# Patient Record
Sex: Female | Born: 1940 | Race: White | Hispanic: No | Marital: Married | Smoking: Former smoker
Health system: Southern US, Community
[De-identification: ages and names within clinical notes are randomized; demographics above are authoritative.]

## PROBLEM LIST (undated history)

## (undated) DIAGNOSIS — R1310 Dysphagia, unspecified: Secondary | ICD-10-CM

## (undated) DIAGNOSIS — K219 Gastro-esophageal reflux disease without esophagitis: Secondary | ICD-10-CM

## (undated) DIAGNOSIS — IMO0001 Reserved for inherently not codable concepts without codable children: Secondary | ICD-10-CM

## (undated) DIAGNOSIS — M109 Gout, unspecified: Secondary | ICD-10-CM

## (undated) DIAGNOSIS — M1990 Unspecified osteoarthritis, unspecified site: Secondary | ICD-10-CM

## (undated) DIAGNOSIS — E785 Hyperlipidemia, unspecified: Secondary | ICD-10-CM

## (undated) DIAGNOSIS — F32A Depression, unspecified: Secondary | ICD-10-CM

## (undated) DIAGNOSIS — N184 Chronic kidney disease, stage 4 (severe): Secondary | ICD-10-CM

## (undated) DIAGNOSIS — E119 Type 2 diabetes mellitus without complications: Secondary | ICD-10-CM

## (undated) DIAGNOSIS — Z9889 Other specified postprocedural states: Secondary | ICD-10-CM

## (undated) DIAGNOSIS — K635 Polyp of colon: Secondary | ICD-10-CM

## (undated) DIAGNOSIS — R000 Tachycardia, unspecified: Secondary | ICD-10-CM

## (undated) DIAGNOSIS — I10 Essential (primary) hypertension: Secondary | ICD-10-CM

## (undated) DIAGNOSIS — Z8719 Personal history of other diseases of the digestive system: Secondary | ICD-10-CM

## (undated) DIAGNOSIS — C801 Malignant (primary) neoplasm, unspecified: Secondary | ICD-10-CM

## (undated) DIAGNOSIS — I499 Cardiac arrhythmia, unspecified: Secondary | ICD-10-CM

## (undated) DIAGNOSIS — F419 Anxiety disorder, unspecified: Secondary | ICD-10-CM

## (undated) DIAGNOSIS — E669 Obesity, unspecified: Secondary | ICD-10-CM

## (undated) DIAGNOSIS — I82409 Acute embolism and thrombosis of unspecified deep veins of unspecified lower extremity: Secondary | ICD-10-CM

## (undated) DIAGNOSIS — K449 Diaphragmatic hernia without obstruction or gangrene: Secondary | ICD-10-CM

## (undated) DIAGNOSIS — J189 Pneumonia, unspecified organism: Secondary | ICD-10-CM

## (undated) DIAGNOSIS — F329 Major depressive disorder, single episode, unspecified: Secondary | ICD-10-CM

## (undated) HISTORY — DX: Gastro-esophageal reflux disease without esophagitis: K21.9

## (undated) HISTORY — DX: Tachycardia, unspecified: R00.0

## (undated) HISTORY — PX: BREAST CYST EXCISION: SHX579

## (undated) HISTORY — PX: EYE SURGERY: SHX253

## (undated) HISTORY — DX: Pneumonia, unspecified organism: J18.9

## (undated) HISTORY — DX: Depression, unspecified: F32.A

## (undated) HISTORY — DX: Personal history of other diseases of the digestive system: Z87.19

## (undated) HISTORY — DX: Anxiety disorder, unspecified: F41.9

## (undated) HISTORY — DX: Essential (primary) hypertension: I10

## (undated) HISTORY — PX: APPENDECTOMY: SHX54

## (undated) HISTORY — DX: Polyp of colon: K63.5

## (undated) HISTORY — PX: SKIN CANCER EXCISION: SHX779

## (undated) HISTORY — DX: Major depressive disorder, single episode, unspecified: F32.9

## (undated) HISTORY — DX: Acute embolism and thrombosis of unspecified deep veins of unspecified lower extremity: I82.409

## (undated) HISTORY — DX: Other specified postprocedural states: Z98.890

## (undated) HISTORY — DX: Gout, unspecified: M10.9

## (undated) HISTORY — DX: Diaphragmatic hernia without obstruction or gangrene: K44.9

## (undated) HISTORY — PX: TOTAL ABDOMINAL HYSTERECTOMY: SHX209

## (undated) HISTORY — PX: OTHER SURGICAL HISTORY: SHX169

## (undated) HISTORY — DX: Type 2 diabetes mellitus without complications: E11.9

## (undated) HISTORY — DX: Obesity, unspecified: E66.9

## (undated) HISTORY — DX: Hyperlipidemia, unspecified: E78.5

---

## 2005-05-20 ENCOUNTER — Ambulatory Visit: Payer: Self-pay | Admitting: Cardiology

## 2013-02-07 ENCOUNTER — Encounter: Payer: Self-pay | Admitting: *Deleted

## 2013-02-07 ENCOUNTER — Encounter: Payer: Self-pay | Admitting: Internal Medicine

## 2013-02-08 ENCOUNTER — Encounter: Payer: Self-pay | Admitting: Internal Medicine

## 2013-02-08 ENCOUNTER — Encounter: Payer: Self-pay | Admitting: Gastroenterology

## 2013-02-08 ENCOUNTER — Ambulatory Visit (INDEPENDENT_AMBULATORY_CARE_PROVIDER_SITE_OTHER): Payer: Medicare Other | Admitting: Internal Medicine

## 2013-02-08 VITALS — BP 122/82 | HR 64 | Temp 98.0°F | Ht 65.0 in | Wt 184.2 lb

## 2013-02-08 DIAGNOSIS — I2584 Coronary atherosclerosis due to calcified coronary lesion: Secondary | ICD-10-CM

## 2013-02-08 DIAGNOSIS — J849 Interstitial pulmonary disease, unspecified: Secondary | ICD-10-CM

## 2013-02-08 DIAGNOSIS — I2510 Atherosclerotic heart disease of native coronary artery without angina pectoris: Secondary | ICD-10-CM

## 2013-02-08 DIAGNOSIS — R1310 Dysphagia, unspecified: Secondary | ICD-10-CM

## 2013-02-08 DIAGNOSIS — J8410 Pulmonary fibrosis, unspecified: Secondary | ICD-10-CM

## 2013-02-08 NOTE — Progress Notes (Signed)
Subjective:    Patient ID: Courtney Madden, female    DOB: 1940/07/15, 72 y.o.   MRN: [id number] Courtney Madden, Courtney Loft, MD  HPI  IOV 02/08/2013   36 r-year-old female referred for abnormal chest x-ray and CT scan of the chest   She says that in summer of 2013 she had 2 episodes of pneumonia with fever and cough and sputum. Apparently at that time chest x-ray showed pneumonia. I do not have these films for review but apparently this year she was followed with a CT scan of the chest 07/19/2012 which is described below showing tree in bud pulmonary infiltrates particularly in the right middle lobe. A followup scan in 10/24/2012 showed no change and there for she's been referred to pulmonary for evaluation of these infiltrates  She does be that she does not have any chest symptoms including dyspnea, cough, wheeze, orthopnea, paroxysmal nocturnal dyspnea, hemoptysis. However, for the last several years he has had dysphagia which is progressive. She rates her dysphagia severe. It's mostly for solids. She is unable to swallow tablets. She describes that sometimes she has to stroke her throat to get food down. Occasionally she is dysphagia for liquids. There is associated 50 pound weight loss in the past one year but she attributes this to starting metformin and lowered appetite. She gives a history of esophageal dilatatin few years ago but the results are only transient  for a week. She feels she needs to see anothr GI. CT scans show aberrant subclavian artery causing esophageal dilatation    reports that she quit smoking about 18 years ago. Her smoking use included Cigarettes. She has a 25 pack-year smoking history. She does not have any smokeless tobacco history on file.  Body mass index is 30.65 kg/(m^2).   Serial imaging  09/19/2006 chest x-ray: Possible right lower lobe infiltrate  05/04/2010 chest x-ray: Definite right lower lobe infiltrate/right middle lobe  CT scan of the chest  07/19/2012: Bilateral 3 in bud and but pulmonary infiltrates right middle lobe is the greatest. Associated esophageal validation due to aberrant right subclavian artery present along with small hiatal hernia. No lymphadenopathy. Coronary artery classification present  CT scan of the chest 10/24/2012: No change Past Medical History  Diagnosis Date  . Hyperlipidemia   . HTN (hypertension)   . Diabetes   . GERD (gastroesophageal reflux disease)   . Gout   . Obesity      Family History  Problem Relation Age of Onset  . Diabetes Son   . Diabetes Son      History   Social History  . Marital Status: Married    Spouse Name: N/A    Number of Children: N/A  . Years of Education: N/A   Occupational History  . Not on file.   Social History Main Topics  . Smoking status: Former Smoker -- 1.00 packs/day for 25 years    Types: Cigarettes    Quit date: 05/10/1994  . Smokeless tobacco: Not on file  . Alcohol Use: No  . Drug Use: No  . Sexual Activity: Not on file   Other Topics Concern  . Not on file   Social History Narrative  . No narrative on file     Allergies  Allergen Reactions  . Ace Inhibitors Cough  . Diovan [Valsartan]     Headache   . Zocor [Simvastatin]     Muscle pain     Outpatient Prescriptions Prior to Visit  Medication Sig Dispense Refill  .  LORazepam (ATIVAN) 0.5 MG tablet Take 0.5 mg by mouth every 8 (eight) hours as needed for anxiety.      . meclizine (ANTIVERT) 25 MG tablet Take 25 mg by mouth 3 (three) times daily as needed.      . metFORMIN (GLUCOPHAGE) 1000 MG tablet Take 1,000 mg by mouth 2 (two) times daily with a meal.      . oxyCODONE-acetaminophen (PERCOCET/ROXICET) 5-325 MG per tablet Take 1 tablet by mouth every 6 (six) hours as needed for pain.      Marland Kitchen propranolol (INDERAL) 40 MG tablet Take 40 mg by mouth daily.      . ranitidine (ZANTAC) 150 MG capsule Take 150 mg by mouth 2 (two) times daily.      . simvastatin (ZOCOR) 40 MG tablet  Take 40 mg by mouth every evening.      . insulin glargine (LANTUS) 100 UNIT/ML injection Inject 54 Units into the skin at bedtime.       No facility-administered medications prior to visit.       Review of Systems  Constitutional: Positive for unexpected weight change. Negative for fever.  HENT: Positive for trouble swallowing. Negative for ear pain, nosebleeds, congestion, sore throat, rhinorrhea, sneezing, dental problem, postnasal drip and sinus pressure.   Eyes: Negative for redness and itching.  Respiratory: Positive for shortness of breath. Negative for cough, chest tightness and wheezing.   Cardiovascular: Negative for palpitations and leg swelling.  Gastrointestinal: Negative for nausea and vomiting.  Genitourinary: Negative for dysuria.  Musculoskeletal: Negative for joint swelling.  Skin: Negative for rash.  Neurological: Negative for headaches.  Hematological: Does not bruise/bleed easily.  Psychiatric/Behavioral: Negative for dysphoric mood. The patient is nervous/anxious.        Objective:   Physical Exam  Vitals reviewed. Constitutional: She is oriented to person, place, and time. She appears well-developed and well-nourished. No distress.  HENT:  Head: Normocephalic and atraumatic.  Right Ear: External ear normal.  Left Ear: External ear normal.  Mouth/Throat: Oropharynx is clear and moist. No oropharyngeal exudate.  HOH  Eyes: Conjunctivae and EOM are normal. Pupils are equal, round, and reactive to light. Right eye exhibits no discharge. Left eye exhibits no discharge. No scleral icterus.  Neck: Normal range of motion. Neck supple. No JVD present. No tracheal deviation present. No thyromegaly present.  Cardiovascular: Normal rate, regular rhythm, normal heart sounds and intact distal pulses.  Exam reveals no gallop and no friction rub.   No murmur heard. Pulmonary/Chest: Effort normal. No respiratory distress. She has no wheezes. She has rales. She exhibits no  tenderness.  R> L Basal crackle  Abdominal: Soft. Bowel sounds are normal. She exhibits no distension and no mass. There is no tenderness. There is no rebound and no guarding.  Musculoskeletal: Normal range of motion. She exhibits no edema and no tenderness.  Lymphadenopathy:    She has no cervical adenopathy.  Neurological: She is alert and oriented to person, place, and time. She has normal reflexes. No cranial nerve deficit. She exhibits normal muscle tone. Coordination normal.  Skin: Skin is warm and dry. No rash noted. She is not diaphoretic. No erythema. No pallor.  Psychiatric: She has a normal mood and affect. Her behavior is normal. Judgment and thought content normal.          Assessment & Plan:

## 2013-02-08 NOTE — Patient Instructions (Addendum)
#  Pulmonary infitlrates  - do CT scan chest at [facility]  - will call with results  #Difficulty swallowing - i think this is cause for lung problems  - see [facility] doc in [facility]  #Coronary Artery Calcification  -= you need to see a cardiologist [place] [facility] [place]; wil hold off for now  #FOllowup - will call with results of CT chest to decide next step - see [facility] doc

## 2013-02-09 DIAGNOSIS — R1310 Dysphagia, unspecified: Secondary | ICD-10-CM | POA: Insufficient documentation

## 2013-02-09 DIAGNOSIS — I2510 Atherosclerotic heart disease of native coronary artery without angina pectoris: Secondary | ICD-10-CM | POA: Insufficient documentation

## 2013-02-09 DIAGNOSIS — J849 Interstitial pulmonary disease, unspecified: Secondary | ICD-10-CM | POA: Insufficient documentation

## 2013-02-09 NOTE — Assessment & Plan Note (Signed)
Suspect due to esophageal dilatation due to aberrant subclavian artery. She has horrible symptoms. She has not seen GI in year. ? Can anything be done. Refer [facility]

## 2013-02-09 NOTE — Assessment & Plan Note (Signed)
Address in due course with cards referra. No chest pain currently

## 2013-02-09 NOTE — Assessment & Plan Note (Signed)
My suspicion is these infiltrates are due to chronic aspiration related to dysphagia. MAI is next in dddx. They seem to have been present in CXR 2011 itself. I will get a CT chest to see stabiuiluty between Chest CT March and June 2014. Sh eis in agreement with plan.

## 2013-02-13 ENCOUNTER — Encounter (HOSPITAL_COMMUNITY): Payer: Self-pay

## 2013-02-13 ENCOUNTER — Ambulatory Visit (HOSPITAL_COMMUNITY)
Admission: RE | Admit: 2013-02-13 | Discharge: 2013-02-13 | Disposition: A | Discharge status: Home / Self Care | Payer: Medicare Other | Source: Ambulatory Visit | Attending: Internal Medicine | Admitting: Internal Medicine

## 2013-02-13 DIAGNOSIS — R1310 Dysphagia, unspecified: Secondary | ICD-10-CM | POA: Insufficient documentation

## 2013-02-13 DIAGNOSIS — R911 Solitary pulmonary nodule: Secondary | ICD-10-CM | POA: Insufficient documentation

## 2013-02-13 DIAGNOSIS — J849 Interstitial pulmonary disease, unspecified: Secondary | ICD-10-CM

## 2013-03-09 ENCOUNTER — Telehealth: Payer: Self-pay | Admitting: Internal Medicine

## 2013-03-09 NOTE — Telephone Encounter (Signed)
02/13/13 CT shows scarring in lungs but again bigger problem is the esophagus. We referred her to GI at [facility] but she has not seen them yet. Please tell her to see them and then fu with me to discuss scarring in lung further;    THanks  Dr. Kalman Shan, M.D., [facility].C.P Pulmonary and Critical Care Medicine Staff Physician [facility] Pulmonary and Critical Care Pager: [phone], If no answer or between  15:00h - 7:00h: call [phone]  03/09/2013 3:07 PM

## 2013-03-12 ENCOUNTER — Encounter: Payer: Self-pay | Admitting: Gastroenterology

## 2013-03-12 ENCOUNTER — Ambulatory Visit (INDEPENDENT_AMBULATORY_CARE_PROVIDER_SITE_OTHER): Payer: Medicare Other | Admitting: Gastroenterology

## 2013-03-12 VITALS — BP 100/70 | HR 76 | Ht 64.25 in | Wt 181.0 lb

## 2013-03-12 DIAGNOSIS — Z860100 Personal history of colon polyps, unspecified: Secondary | ICD-10-CM | POA: Insufficient documentation

## 2013-03-12 DIAGNOSIS — R1310 Dysphagia, unspecified: Secondary | ICD-10-CM

## 2013-03-12 NOTE — Patient Instructions (Signed)
You have been scheduled for a Barium Esophogram at [facility] on Wed 03/14/2013 at 9am. Please arrive 15 minutes prior to your appointment for registration. Make certain not to have anything to eat or drink 6 hours prior to your test. If you need to reschedule for any reason, please contact radiology at [phone] to do so. __________________________________________________________________ A barium swallow is an examination that concentrates on views of the esophagus. This tends to be a double contrast exam (barium and two liquids which, when combined, create a gas to distend the wall of the oesophagus) or single contrast (non-ionic iodine based). The study is usually tailored to your symptoms so a good history is essential. Attention is paid during the study to the form, structure and configuration of the esophagus, looking for functional disorders (such as aspiration, dysphagia, achalasia, motility and reflux) EXAMINATION You may be asked to change into a gown, depending on the type of swallow being performed. A radiologist and radiographer will perform the procedure. The radiologist will advise you of the type of contrast selected for your procedure and direct you during the exam. You will be asked to stand, sit or lie in several different positions and to hold a small amount of fluid in your mouth before being asked to swallow while the imaging is performed .In some instances you may be asked to swallow barium coated marshmallows to assess the motility of a solid food bolus. The exam can be recorded as a digital or video fluoroscopy procedure. POST PROCEDURE It will take 1-2 days for the barium to pass through your system. To facilitate this, it is important, unless otherwise directed, to increase your fluids for the next 24-48hrs and to resume your normal diet.  This test typically takes about 30 minutes to  perform. __________________________________________________________________________________

## 2013-03-12 NOTE — Progress Notes (Signed)
History of Present Illness: 72 year old white female referred for evaluation of dysphagia.  She underwent esophageal dilatation in July, 2013 with only temporary relief lasting about a month.  Since that time she's had progressive dysphagia to solids including pills and now liquids.  She's had recurrent episodes of pneumonia raising the concern about aspiration.  CT scan from one month ago demonstrated circumferential thickening in the proximal third of the esophagus proximal to which there is esophageal dilation with an air-fluid level.  Also noted was that the thickening occurred proximal to a transition point where the esophagus traverses between the trachea and the patient's  right subclavian artery raising the question of extrinsic compression of the esophagus.  Patient has rare pyrosis which is well controlled with ranitidine.  She denies chest or abdominal pain.  History is pertinent for colon polyps that were diagnosed and removed over 10 years ago.  She has not had a followup exam because of pain that she experienced during her first colonoscopy.  He denies change of bowel habits or bleeding.    Past Medical History  Diagnosis Date  . Hyperlipidemia   . HTN (hypertension)   . Diabetes   . GERD (gastroesophageal reflux disease)   . Gout   . Obesity   . Anxiety   . Colon polyps   . Hiatal hernia   . Depression   . Status post dilation of esophageal narrowing   . DVT (deep venous thrombosis)   . Pneumonia   . Tachycardia    Past Surgical History  Procedure Laterality Date  . Total abdominal hysterectomy    . Appendectomy    . Breast cyst excision Left    family history includes Diabetes in her son. Current Outpatient Prescriptions  Medication Sig Dispense Refill  . allopurinol (ZYLOPRIM) 300 MG tablet Take 300 mg by mouth daily.      Marland Kitchen aspirin 81 MG tablet Take 81 mg by mouth daily.      Marland Kitchen LORazepam (ATIVAN) 0.5 MG tablet Take 0.5 mg by mouth as needed for anxiety.      .  metFORMIN (GLUCOPHAGE) 1000 MG tablet Take 1,000 mg by mouth 2 (two) times daily with a meal.      . oxyCODONE-acetaminophen (PERCOCET/ROXICET) 5-325 MG per tablet Take 1 tablet by mouth every 4 (four) hours as needed for pain.      Marland Kitchen propranolol (INDERAL) 40 MG tablet Take 40 mg by mouth daily.      . ranitidine (ZANTAC) 150 MG capsule Take 150 mg by mouth 2 (two) times daily.      . simvastatin (ZOCOR) 40 MG tablet Take 40 mg by mouth every evening.       No current facility-administered medications for this visit.   Allergies as of 03/12/2013 - Review Complete 03/12/2013  Allergen Reaction Noted  . Ace inhibitors Cough 02/07/2013  . Diovan [valsartan]  02/07/2013  . Zocor [simvastatin]  02/07/2013    reports that she quit smoking about 18 years ago. Her smoking use included Cigarettes. She has a 25 pack-year smoking history. She has never used smokeless tobacco. She reports that she does not drink alcohol or use illicit drugs.     Review of Systems: She complains of some weakness in her lower extremities Pertinent positive and negative review of systems were noted in the above HPI section. All other review of systems were otherwise negative.  Vital signs were reviewed in today's medical record Physical Exam: General: Well developed , well nourished, no  acute distress Skin: anicteric Head: Normocephalic and atraumatic Eyes:  sclerae anicteric, EOMI Ears: Normal auditory acuity Mouth: No deformity or lesions Neck: Supple, no masses or thyromegaly Lungs: Clear throughout to auscultation Heart: Regular rate and rhythm; no murmurs, rubs or bruits Abdomen: Soft, non tender and non distended. No masses, hepatosplenomegaly or hernias noted. Normal Bowel sounds Rectal:deferred Musculoskeletal: Symmetrical with no gross deformities  Skin: No lesions on visible extremities Pulses:  Normal pulses noted Extremities: No clubbing, cyanosis, edema or deformities noted Neurological: Alert  oriented x 4, grossly nonfocal Cervical Nodes:  No significant cervical adenopathy Inguinal Nodes: No significant inguinal adenopathy Psychological:  Alert and cooperative. Normal mood and affect

## 2013-03-12 NOTE — Assessment & Plan Note (Addendum)
CT scan raises the question of dysphagia lusoria, on the basis of a possible aberrant right subclavian artery.  Extrinsic compression from neoplasm, and mucosal strictures are other possibilities.  On the bases of air-fluid levels in her very proximal esophagus I strongly suspect that she may be suffering from silent aspiration.  Recommendations #1 barium swallow.  I will make other recommendations following results of this study

## 2013-03-12 NOTE — Assessment & Plan Note (Signed)
Patient is overdue for followup colonoscopy.  She will be scheduled for a colonoscopy once her upper GI issues are resolved.

## 2013-03-13 NOTE — Telephone Encounter (Signed)
Pt saw GI on 03-12-13. [id number] to get pt scheduled to see MR. Carron Curie, CMA

## 2013-03-13 NOTE — Telephone Encounter (Signed)
Pt returned Jennifer's call.  Holly D Pryor ° °

## 2013-03-13 NOTE — Telephone Encounter (Signed)
Called, spoke with pt.  Informed her of below per Jenn.  She verbalized understanding.  Pt is requesting a f/u in several wks d/t having multiple OVs recently and having high copays.  We have scheduled this for Nov 20 at 1:45 pm.  Pt aware and voiced no further questions or concerns at this time.  Will sign off and route to MR as FYI as to when pt's appt is scheduled for.

## 2013-03-13 NOTE — Telephone Encounter (Signed)
Thanks. Ensure fu early Jan 2015 with me  Dr. Kalman Shan, M.D., [facility].C.P Pulmonary and Critical Care Medicine Staff Physician [facility]  Pulmonary and Critical Care Pager: [phone], If no answer or between  15:00h - 7:00h: call [phone]  03/13/2013 7:52 PM

## 2013-03-14 ENCOUNTER — Ambulatory Visit (HOSPITAL_COMMUNITY)
Admission: RE | Admit: 2013-03-14 | Discharge: 2013-03-14 | Disposition: A | Discharge status: Home / Self Care | Payer: Medicare Other | Source: Ambulatory Visit | Attending: Gastroenterology | Admitting: Gastroenterology

## 2013-03-14 DIAGNOSIS — K225 Diverticulum of esophagus, acquired: Secondary | ICD-10-CM | POA: Insufficient documentation

## 2013-03-14 DIAGNOSIS — R1310 Dysphagia, unspecified: Secondary | ICD-10-CM

## 2013-03-29 ENCOUNTER — Ambulatory Visit: Payer: Medicare Other | Admitting: Internal Medicine

## 2013-04-09 ENCOUNTER — Ambulatory Visit (INDEPENDENT_AMBULATORY_CARE_PROVIDER_SITE_OTHER): Payer: Medicare Other | Admitting: Gastroenterology

## 2013-04-09 ENCOUNTER — Encounter: Payer: Self-pay | Admitting: Gastroenterology

## 2013-04-09 VITALS — BP 124/80 | HR 88 | Ht 64.25 in | Wt 180.1 lb

## 2013-04-09 DIAGNOSIS — Z860100 Personal history of colon polyps, unspecified: Secondary | ICD-10-CM

## 2013-04-09 DIAGNOSIS — K225 Diverticulum of esophagus, acquired: Secondary | ICD-10-CM

## 2013-04-09 NOTE — Patient Instructions (Signed)
You have an appointment with Dr Suzanna Obey on 04/16/2013 at 2:50pm please arrive 15 minutes before your appointment with co-pay and list of medications  If you need to reschedule there number is [phone]

## 2013-04-09 NOTE — Assessment & Plan Note (Signed)
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  Recommendations #1 ENT referral for consideration of myotomy

## 2013-04-09 NOTE — Assessment & Plan Note (Signed)
Patient is overdue for followup colonoscopy.  She will be scheduled for a colonoscopy once her upper GI issues are resolved. 

## 2013-04-09 NOTE — Progress Notes (Signed)
History of Present Illness:  The patient has returned following barium swallow.  This demonstrated a sizable Zenker's diverticulum.  She continues to have dysphagia.    Review of Systems: Pertinent positive and negative review of systems were noted in the above HPI section. All other review of systems were otherwise negative.    Current Medications, Allergies, Past Medical History, Past Surgical History, Family History and Social History were reviewed in [employer] electronic medical record  Vital signs were reviewed in today's medical record. Physical Exam: General: Well developed , well nourished, no acute distress

## 2014-04-02 ENCOUNTER — Encounter: Payer: Self-pay | Admitting: Physician Assistant

## 2014-04-02 ENCOUNTER — Ambulatory Visit (INDEPENDENT_AMBULATORY_CARE_PROVIDER_SITE_OTHER): Payer: Medicare Other | Admitting: Physician Assistant

## 2014-04-02 VITALS — BP 134/84 | HR 64 | Ht 64.25 in | Wt 168.1 lb

## 2014-04-02 DIAGNOSIS — R1314 Dysphagia, pharyngoesophageal phase: Secondary | ICD-10-CM

## 2014-04-02 DIAGNOSIS — K225 Diverticulum of esophagus, acquired: Secondary | ICD-10-CM

## 2014-04-02 DIAGNOSIS — Z1211 Encounter for screening for malignant neoplasm of colon: Secondary | ICD-10-CM

## 2014-04-02 NOTE — Progress Notes (Signed)
Patient ID: Courtney Madden, female   DOB: 03-Jan-1941, 73 y.o.   MRN: [id number]   Subjective:    Patient ID: Courtney Madden, female    DOB: 12-13-1940, 73 y.o.   MRN: [id number]  HPI Shaneequa is a pleasant 73 year old white female known to Dr. [name]. She comes in today for evaluation of dysphagia. She was last seen in December 2014 and at that time had been diagnosed with a large Zenker's diverticulum found on barium swallow and was referred to ENT for consideration of myotomy. Patient states that she did not follow through with that appointment and comes back now stating that her symptoms have gradually progressed over the past year. Her weight is down about 12 pounds. She says she has to eat incredibly slowly and also is eating very soft mushy foods only as solid food and meat tend to cause her more difficulty. She says she also has to sip liquids very slowly and is unable to drink regularly from a cup because she gets coughing and choking. She says she has some symptoms with about every meal but if she follows all the precautions including a very soft diet she does okay. She does have frequent episodes of regurgitation but rarely actually vomits food back up. She has had a prior history of an aspiration-related pneumonia but has not had any problems in the past year. She also has not had any colon screening for many years and says that she had a bad experience in the remote past with colonoscopy but was told that she had polyps. Not want to have another colonoscopy She currently has no lower GI symptoms. Family history is negative for colon cancer.  Review of Systems Pertinent positive and negative review of systems were noted in the above HPI section.  All other review of systems was otherwise negative.  Outpatient Encounter Prescriptions as of 04/02/2014  Medication Sig  . ACCU-CHEK AVIVA PLUS test strip as needed.   Marland Kitchen allopurinol (ZYLOPRIM) 300 MG tablet Take 300 mg by mouth  daily.  Marland Kitchen aspirin 81 MG tablet Take 81 mg by mouth daily.  Marland Kitchen LORazepam (ATIVAN) 0.5 MG tablet Take 0.5 mg by mouth as needed for anxiety.  . metFORMIN (GLUCOPHAGE) 1000 MG tablet Take 1,000 mg by mouth daily.   Marland Kitchen oxyCODONE-acetaminophen (PERCOCET/ROXICET) 5-325 MG per tablet Take 1 tablet by mouth every 4 (four) hours as needed for pain.  Marland Kitchen propranolol (INDERAL) 40 MG tablet Take 40 mg by mouth daily.  . ranitidine (ZANTAC) 150 MG capsule Take 150 mg by mouth 2 (two) times daily.  . simvastatin (ZOCOR) 40 MG tablet Take 40 mg by mouth every evening.   Allergies  Allergen Reactions  . Ace Inhibitors Cough  . Diovan [Valsartan]     Headache   . Zocor [Simvastatin]     Muscle pain   Patient Active Problem List   Diagnosis Date Noted  . Zenker's diverticulum 04/09/2013  . Personal history of colonic polyps 03/12/2013  . ILD (interstitial lung disease) 02/09/2013  . Coronary artery calcification 02/09/2013   History   Social History  . Marital Status: Married    Spouse Name: N/A    Number of Children: 2  . Years of Education: N/A   Occupational History  . retired    Social History Main Topics  . Smoking status: Former Smoker -- 1.00 packs/day for 25 years    Types: Cigarettes    Quit date: 05/10/1994  . Smokeless tobacco: Never Used  .  Alcohol Use: No  . Drug Use: No  . Sexual Activity: Not on file   Other Topics Concern  . Not on file   Social History Narrative    Ms. Courtney Madden's family history includes Diabetes in her son.      Objective:    Filed Vitals:   04/02/14 1050  BP: 134/84  Pulse: 64    Physical Exam   well-developed older white female in no acute distress, height 5 foot 4 weight 168 down 12 pounds over the past 1 year. HEENT; nontraumatic normocephalic EOMI PERRLA sclera anicteric, Supple; no JVD, Cardiovascular; regular rate and rhythm with S1-S2 no murmur or gallop, Pulmonary ;clear bilaterally, Abdomen; soft nontender nondistended bowel sounds  are active there is no palpable mass or hepatosplenomegaly, Rectal; exam not done, Extremities;no clubbing cyanosis or edema skin warm and dry, Psych; mood and affect appropriate    Assessment & Plan:   #1 73 yo female with previously documented large Zenker's diverticulum and persistent gradually progressive difficulty with dysphagia. Patient has lost 12 pounds over the past year. She did not proceed with ENT referral last year when this was advised but is willing to consider surgery at this time. #2: Neoplasia surveillance- declines follow-up colonoscopy but agreeable to Cologard #3 interstitial lung disease   Plan; patient needs repair of the Zenker's diverticulum if she expects to have any improvement in her dysphagia. We discussed referral to Dr. Harl Bowie at [facility] for endoscopic repair of the Zenker's. She does not feel that she will be able to travel back and forth to [facility] and asks for ENT referral in [place]. Will discuss with Dr. [name] and refer She will continue soft diet, upright position for all meals and for at least 1 hour postprandially Discussed colon guard stool DNA testing for colon neoplasia surveillance and she is agreeable, and this will be ordered.  Amy Genia Harold PA-C 04/02/2014

## 2014-04-02 NOTE — Patient Instructions (Signed)
We have scheduled you for a Cologuard DNA testing for colonoscopy screening. You will receive a call from this company, [employer], . Their number is [phone].  You will also receive a box from this company . We have given you an instruction booklet.  We will call you with results but this may take several weeks.  We will Call you with a referral appointment to an Ear, Nose and Throat Doctor.

## 2014-04-08 NOTE — Progress Notes (Signed)
Reviewed and agree with management. Fatina Sprankle D. Taleisha Kaczynski, M.D., FACG  

## 2014-04-09 ENCOUNTER — Telehealth: Payer: Self-pay

## 2014-04-09 ENCOUNTER — Other Ambulatory Visit: Payer: Self-pay

## 2014-04-09 DIAGNOSIS — Q396 Congenital diverticulum of esophagus: Secondary | ICD-10-CM

## 2014-04-09 NOTE — Telephone Encounter (Signed)
Patient notified of appointment 04/17/14 at 3pm-records faxed

## 2014-04-09 NOTE — Telephone Encounter (Signed)
-----   Message from Alfredia Ferguson, PA-C sent at 04/08/2014  5:20 PM EST ----- Courtney Madden- spoke with [name] about this pt, please get her an appt with Dr Erik Obey ENT  For a large  Zenkers diverticulum which needs repaired- be sure to send records- thanks

## 2014-04-17 ENCOUNTER — Other Ambulatory Visit: Payer: Self-pay | Admitting: Otolaryngology

## 2014-04-17 NOTE — H&P (Signed)
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  She has been having swallowing issues for several years gradually worse.  She is now limited to very soft diet and liquids.  She has to pressure pills and exam with applesauce and still very slow to swallow to she has a very long history of reflux for which she has been using Zantac twice daily for some time.  In the past 2-1/2 years, she has lost 100 pounds.  It is unclear if this is just from swallowing issues.  She is very slow to be very careful.  She brings up stomach acid regularly, but only rarely undigested food.  She was evaluated by Dr. Britta Mccreedy, gastroenterologist, in [place] who  performed upper GI endoscopy and dilatation.  This basically gave her no relief.  She has recently seen lower pulmonary for evaluation of a spot on her lung seen on chest x-ray.  This was felt to be a possible scar from pneumonia, possibly aspiration.  A barium swallow one year ago did show an esophageal diverticulum above the cricopharyngeus.  Plans were started to have her seen at [facility] gastroenterology for an endoscopic approach to her Zenker's diverticulum.  She was sent here instead by Dr. [name].  Her diabetes is very much better after losing weight.  She does not smoke.   We decided to proceed with an endoscopic diverticulotomy later this week.  I discussed the surgery in detail including risks and complications.  Questions were answered and informed consent was obtained.  I discussed the hospital course, typically overnight, and advancement of diet and activity.  I gave her prescriptions for liquid hydrocodone and for Prilosec.  PMH: Past Medical History  Diagnosis Date  . Hyperlipidemia   . HTN (hypertension)   . Diabetes   . GERD (gastroesophageal reflux disease)   . Gout   . Obesity   . Anxiety   . Colon  polyps   . Hiatal hernia   . Depression   . Status post dilation of esophageal narrowing   . DVT (deep venous thrombosis)   . Pneumonia   . Tachycardia     Surg Hx: Past Surgical History  Procedure Laterality Date  . Total abdominal hysterectomy    . Appendectomy    . Breast cyst excision Left     FHx:   Family History  Problem Relation Age of Onset  . Diabetes Son     x 2 sons   SocHx:  reports that she quit smoking about 19 years ago. Her smoking use included Cigarettes. She has a 25 pack-year smoking history. She has never used smokeless tobacco. She reports that she does not drink alcohol or use illicit drugs.  ALLERGIES:  Allergies  Allergen Reactions  . Ace Inhibitors Cough  . Diovan [Valsartan]     Headache   . Zocor [Simvastatin]     Muscle pain     (Not in a hospital admission)  No results found for this or any previous visit (from the past 48 hour(s)). No results found.  LGX:QJJHERDE: Not feeling tired (fatigue).  No fever, no night sweats, and no recent weight loss. Head: No headache. Eyes: No eye symptoms. Otolaryngeal: No hearing loss, no earache, no tinnitus, and no purulent nasal discharge.  No nasal passage blockage (stuffiness), no snoring, no sneezing, no hoarseness, and no sore throat. Cardiovascular: No  chest pain or discomfort  and no palpitations. Pulmonary: No dyspnea, no cough, and no wheezing. Gastrointestinal: No dysphagia.  Heartburn.  No nausea.  Abdominal pain.  No melena.  No diarrhea. Genitourinary: No dysuria. Endocrine: No muscle weakness. Musculoskeletal: No calf muscle cramps, no arthralgias, and no soft tissue swelling. Neurological: No dizziness, no fainting, no tingling, and no numbness. Psychological: Anxiety  and depression. Skin: No rash.  BP:110/59,  HR: 86 b/min,  Height: 5 ft 5.5 in, Weight: 169 lb , BMI: 27.7 kg/m2,   PHYSICAL EXAM: She is animated and not distressed.  Mental status seems appropriate.  Her voice  is occasionally moist and she coughs and clears her throat occasionally.  No breathing difficulty.  The head is atraumatic and neck supple.  Cranial nerves intact.  Ear canals are clear with normal drums.  Anterior nose is moist and patent.  Oral cavity is moist with teeth in good repair.  Oropharynx clear.  Hypopharynx was reasonably well seen with a mirror examination.  Vocal cords are fully mobile.  There is some pooling in the post cricoid hypopharynx.  No specific lesions visualized.  Neck without adenopathy.   Lungs: Clear to auscultation Heart: Regular rate and rhythm without murmurs Abdomen: Soft, active Extremities: Normal configuration Neurologic: Symmetric, grossly intact.  Studies Reviewed:  ba swallow    Assessment/Plan Acid reflux (530.81) (K21.9). 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  You have an esophageal diverticulum which is interfering with swallowing, causing weight loss and even possibly pneumonia.  We will do an endoscopic approach thtough the mouth which should be fairly easy and quite successful.  In the meantime, take Prilosec twice daily instead of ranitidine.  stop your baby aspirin for 10 days  before surgery.  I will see you back here right before surgery.  Omeprazole 20 MG Oral Capsule Delayed Release;TAKE 1 CAPSULE TWICE DAILY; Qty60; R3; Rx. Hydrocodone-Acetaminophen 7.5-325 MG/15ML Oral Solution;2-3 tsp po q4h prn pain; [id number]; R0; Rx  [name] 45/01/9773, 9:41 PM

## 2014-04-18 ENCOUNTER — Ambulatory Visit: Payer: Medicare Other | Admitting: Gastroenterology

## 2014-04-18 ENCOUNTER — Encounter (HOSPITAL_COMMUNITY): Payer: Self-pay | Admitting: *Deleted

## 2014-04-18 MED ORDER — CEFAZOLIN SODIUM 1-5 GM-% IV SOLN
1.0000 g | INTRAVENOUS | Status: AC
  Administered 2014-04-19: 2 g via INTRAVENOUS
  Filled 2014-04-18: qty 50

## 2014-04-19 ENCOUNTER — Ambulatory Visit (HOSPITAL_COMMUNITY): Payer: Medicare Other | Admitting: Certified Registered Nurse Anesthetist

## 2014-04-19 ENCOUNTER — Ambulatory Visit (HOSPITAL_COMMUNITY)
Admission: RE | Admit: 2014-04-19 | Discharge: 2014-04-20 | Disposition: A | Discharge status: Home / Self Care | Payer: Medicare Other | Source: Ambulatory Visit | Attending: Otolaryngology | Admitting: Otolaryngology

## 2014-04-19 ENCOUNTER — Encounter (HOSPITAL_COMMUNITY)
Admission: RE | Disposition: A | Discharge status: Home / Self Care | Payer: Self-pay | Source: Ambulatory Visit | Attending: Otolaryngology

## 2014-04-19 ENCOUNTER — Encounter (HOSPITAL_COMMUNITY): Payer: Self-pay | Admitting: *Deleted

## 2014-04-19 DIAGNOSIS — F419 Anxiety disorder, unspecified: Secondary | ICD-10-CM | POA: Diagnosis not present

## 2014-04-19 DIAGNOSIS — E669 Obesity, unspecified: Secondary | ICD-10-CM | POA: Diagnosis not present

## 2014-04-19 DIAGNOSIS — E119 Type 2 diabetes mellitus without complications: Secondary | ICD-10-CM | POA: Insufficient documentation

## 2014-04-19 DIAGNOSIS — K225 Diverticulum of esophagus, acquired: Secondary | ICD-10-CM | POA: Diagnosis present

## 2014-04-19 DIAGNOSIS — K219 Gastro-esophageal reflux disease without esophagitis: Secondary | ICD-10-CM | POA: Diagnosis not present

## 2014-04-19 DIAGNOSIS — I440 Atrioventricular block, first degree: Secondary | ICD-10-CM | POA: Insufficient documentation

## 2014-04-19 DIAGNOSIS — K449 Diaphragmatic hernia without obstruction or gangrene: Secondary | ICD-10-CM | POA: Diagnosis not present

## 2014-04-19 DIAGNOSIS — F329 Major depressive disorder, single episode, unspecified: Secondary | ICD-10-CM | POA: Diagnosis not present

## 2014-04-19 DIAGNOSIS — Z888 Allergy status to other drugs, medicaments and biological substances status: Secondary | ICD-10-CM | POA: Insufficient documentation

## 2014-04-19 DIAGNOSIS — R1319 Other dysphagia: Secondary | ICD-10-CM | POA: Diagnosis not present

## 2014-04-19 DIAGNOSIS — M109 Gout, unspecified: Secondary | ICD-10-CM | POA: Diagnosis not present

## 2014-04-19 DIAGNOSIS — E785 Hyperlipidemia, unspecified: Secondary | ICD-10-CM | POA: Insufficient documentation

## 2014-04-19 DIAGNOSIS — I10 Essential (primary) hypertension: Secondary | ICD-10-CM | POA: Diagnosis not present

## 2014-04-19 DIAGNOSIS — Z6827 Body mass index (BMI) 27.0-27.9, adult: Secondary | ICD-10-CM | POA: Insufficient documentation

## 2014-04-19 DIAGNOSIS — Z86718 Personal history of other venous thrombosis and embolism: Secondary | ICD-10-CM | POA: Diagnosis not present

## 2014-04-19 DIAGNOSIS — I498 Other specified cardiac arrhythmias: Secondary | ICD-10-CM | POA: Insufficient documentation

## 2014-04-19 HISTORY — DX: Cardiac arrhythmia, unspecified: I49.9

## 2014-04-19 HISTORY — DX: Unspecified osteoarthritis, unspecified site: M19.90

## 2014-04-19 HISTORY — DX: Malignant (primary) neoplasm, unspecified: C80.1

## 2014-04-19 HISTORY — PX: ZENKER'S DIVERTICULECTOMY: SHX5223

## 2014-04-19 HISTORY — PX: ESOPHAGOSCOPY: SHX5534

## 2014-04-19 HISTORY — DX: Reserved for inherently not codable concepts without codable children: IMO0001

## 2014-04-19 HISTORY — DX: Dysphagia, unspecified: R13.10

## 2014-04-19 LAB — BASIC METABOLIC PANEL
Anion gap: 14 (ref 5–15)
BUN: 21 mg/dL (ref 6–23)
CALCIUM: 9.9 mg/dL (ref 8.4–10.5)
CHLORIDE: 104 meq/L (ref 96–112)
CO2: 25 mEq/L (ref 19–32)
CREATININE: 1.6 mg/dL — AB (ref 0.50–1.10)
GFR calc Af Amer: 35 mL/min — ABNORMAL LOW (ref >90)
GFR calc non Af Amer: 30 mL/min — ABNORMAL LOW (ref >90)
GLUCOSE: 106 mg/dL — AB (ref 70–99)
Potassium: 4.2 mEq/L (ref 3.7–5.3)
Sodium: 143 mEq/L (ref 137–147)

## 2014-04-19 LAB — CBC
HCT: 41.2 % (ref 36.0–46.0)
Hemoglobin: 13.7 g/dL (ref 12.0–15.0)
MCH: 32.9 pg (ref 26.0–34.0)
MCHC: 33.3 g/dL (ref 30.0–36.0)
MCV: 98.8 fL (ref 78.0–100.0)
PLATELETS: 262 10*3/uL (ref 150–400)
RBC: 4.17 MIL/uL (ref 3.87–5.11)
RDW: 14.2 % (ref 11.5–15.5)
WBC: 9.6 10*3/uL (ref 4.0–10.5)

## 2014-04-19 LAB — GLUCOSE, CAPILLARY
GLUCOSE-CAPILLARY: 94 mg/dL (ref 70–99)
GLUCOSE-CAPILLARY: 69 mg/dL — AB (ref 70–99)
GLUCOSE-CAPILLARY: 89 mg/dL (ref 70–99)

## 2014-04-19 SURGERY — ZENKER'S DIVERTICULECTOMY
Anesthesia: General | Site: Esophagus

## 2014-04-19 MED ORDER — HYDROCODONE-ACETAMINOPHEN 7.5-325 MG/15ML PO SOLN: 10.0000 mL | ORAL | Status: DC | PRN

## 2014-04-19 MED ORDER — DEXTROSE-NACL 5-0.45 % IV SOLN
INTRAVENOUS | Status: DC
Start: 2014-04-19 — End: 2014-04-20
  Administered 2014-04-19: via INTRAVENOUS

## 2014-04-19 MED ORDER — COCAINE HCL 4 % EX SOLN
CUTANEOUS | Status: AC
  Filled 2014-04-19: qty 4

## 2014-04-19 MED ORDER — LIDOCAINE HCL (CARDIAC) 20 MG/ML IV SOLN
INTRAVENOUS | Status: AC
  Filled 2014-04-19: qty 5

## 2014-04-19 MED ORDER — OXYMETAZOLINE HCL 0.05 % NA SOLN
NASAL | Status: AC
  Filled 2014-04-19: qty 15

## 2014-04-19 MED ORDER — DEXAMETHASONE SODIUM PHOSPHATE 4 MG/ML IJ SOLN
INTRAMUSCULAR | Status: AC
  Filled 2014-04-19: qty 1

## 2014-04-19 MED ORDER — LIDOCAINE HCL (CARDIAC) 20 MG/ML IV SOLN
INTRAVENOUS | Status: DC | PRN
  Administered 2014-04-19: 60 mg via INTRAVENOUS

## 2014-04-19 MED ORDER — FENTANYL CITRATE 0.05 MG/ML IJ SOLN
INTRAMUSCULAR | Status: AC
  Filled 2014-04-19: qty 2

## 2014-04-19 MED ORDER — FENTANYL CITRATE 0.05 MG/ML IJ SOLN
25.0000 ug | INTRAMUSCULAR | Status: DC | PRN
  Administered 2014-04-19 (×2): 50 ug via INTRAVENOUS
  Administered 2014-04-19 (×2): 25 ug via INTRAVENOUS

## 2014-04-19 MED ORDER — PROPOFOL INFUSION 10 MG/ML OPTIME
INTRAVENOUS | Status: DC | PRN
  Administered 2014-04-19: 25 mg/kg/h via INTRAVENOUS

## 2014-04-19 MED ORDER — SODIUM CHLORIDE 0.9 % IV SOLN
10.0000 mg | INTRAVENOUS | Status: DC | PRN
  Administered 2014-04-19: 10 ug/min via INTRAVENOUS

## 2014-04-19 MED ORDER — SUCCINYLCHOLINE CHLORIDE 20 MG/ML IJ SOLN
INTRAMUSCULAR | Status: DC | PRN
  Administered 2014-04-19: 120 mg via INTRAVENOUS

## 2014-04-19 MED ORDER — PANTOPRAZOLE SODIUM 40 MG PO TBEC
40.0000 mg | DELAYED_RELEASE_TABLET | Freq: Every day | ORAL | Status: DC
  Administered 2014-04-19: 40 mg via ORAL
  Filled 2014-04-19: qty 1

## 2014-04-19 MED ORDER — ONDANSETRON HCL 4 MG/2ML IJ SOLN: 4.0000 mg | INTRAMUSCULAR | Status: DC | PRN

## 2014-04-19 MED ORDER — SODIUM CHLORIDE 0.9 % IJ SOLN
INTRAMUSCULAR | Status: AC
  Filled 2014-04-19: qty 10

## 2014-04-19 MED ORDER — PROPRANOLOL HCL 40 MG PO TABS
40.0000 mg | ORAL_TABLET | Freq: Every day | ORAL | Status: DC
  Administered 2014-04-20: 40 mg via ORAL
  Filled 2014-04-19: qty 1

## 2014-04-19 MED ORDER — LORAZEPAM 0.5 MG PO TABS: 0.5000 mg | ORAL_TABLET | ORAL | Status: DC | PRN

## 2014-04-19 MED ORDER — LACTATED RINGERS IV SOLN
INTRAVENOUS | Status: DC
  Administered 2014-04-19: 12:00:00 via INTRAVENOUS

## 2014-04-19 MED ORDER — GLUCOSE BLOOD VI STRP: 1.0000 | ORAL_STRIP | Status: DC | PRN

## 2014-04-19 MED ORDER — MIDAZOLAM HCL 5 MG/5ML IJ SOLN
INTRAMUSCULAR | Status: DC | PRN
  Administered 2014-04-19: 2 mg via INTRAVENOUS

## 2014-04-19 MED ORDER — PANTOPRAZOLE SODIUM 40 MG PO PACK
40.0000 mg | PACK | Freq: Every day | ORAL | Status: DC
  Administered 2014-04-19: 40 mg
  Filled 2014-04-19 (×2): qty 20

## 2014-04-19 MED ORDER — PROPOFOL 10 MG/ML IV BOLUS
INTRAVENOUS | Status: DC | PRN
  Administered 2014-04-19: 140 mg via INTRAVENOUS

## 2014-04-19 MED ORDER — ONDANSETRON HCL 4 MG PO TABS: 4.0000 mg | ORAL_TABLET | ORAL | Status: DC | PRN

## 2014-04-19 MED ORDER — LACTATED RINGERS IV SOLN
INTRAVENOUS | Status: DC | PRN
  Administered 2014-04-19: 14:00:00 via INTRAVENOUS

## 2014-04-19 MED ORDER — ARTIFICIAL TEARS OP OINT
TOPICAL_OINTMENT | OPHTHALMIC | Status: AC
  Filled 2014-04-19: qty 3.5

## 2014-04-19 MED ORDER — GLYCOPYRROLATE 0.2 MG/ML IJ SOLN
INTRAMUSCULAR | Status: AC
  Filled 2014-04-19: qty 3

## 2014-04-19 MED ORDER — 0.9 % SODIUM CHLORIDE (POUR BTL) OPTIME
TOPICAL | Status: DC | PRN
  Administered 2014-04-19: 1000 mL

## 2014-04-19 MED ORDER — ONDANSETRON HCL 4 MG/2ML IJ SOLN
INTRAMUSCULAR | Status: DC | PRN
  Administered 2014-04-19: 4 mg via INTRAVENOUS

## 2014-04-19 MED ORDER — DEXAMETHASONE SODIUM PHOSPHATE 4 MG/ML IJ SOLN
INTRAMUSCULAR | Status: DC | PRN
  Administered 2014-04-19: 4 mg via INTRAVENOUS

## 2014-04-19 MED ORDER — FENTANYL CITRATE 0.05 MG/ML IJ SOLN
INTRAMUSCULAR | Status: DC | PRN
  Administered 2014-04-19: 100 ug via INTRAVENOUS

## 2014-04-19 MED ORDER — MIDAZOLAM HCL 2 MG/2ML IJ SOLN
INTRAMUSCULAR | Status: AC
  Filled 2014-04-19: qty 2

## 2014-04-19 MED ORDER — METHYLENE BLUE 1 % INJ SOLN
INTRAMUSCULAR | Status: AC
  Filled 2014-04-19: qty 10

## 2014-04-19 MED ORDER — EPINEPHRINE HCL (NASAL) 0.1 % NA SOLN
NASAL | Status: AC
  Filled 2014-04-19: qty 30

## 2014-04-19 MED ORDER — ROCURONIUM BROMIDE 50 MG/5ML IV SOLN
INTRAVENOUS | Status: AC
  Filled 2014-04-19: qty 1

## 2014-04-19 MED ORDER — MORPHINE SULFATE 2 MG/ML IJ SOLN
1.0000 mg | INTRAMUSCULAR | Status: DC | PRN
  Administered 2014-04-19: 1 mg via INTRAVENOUS
  Filled 2014-04-19: qty 1

## 2014-04-19 MED ORDER — ONDANSETRON HCL 4 MG/2ML IJ SOLN
INTRAMUSCULAR | Status: AC
  Filled 2014-04-19: qty 2

## 2014-04-19 MED ORDER — FENTANYL CITRATE 0.05 MG/ML IJ SOLN
INTRAMUSCULAR | Status: AC
  Filled 2014-04-19: qty 5

## 2014-04-19 MED ORDER — PROPOFOL 10 MG/ML IV BOLUS
INTRAVENOUS | Status: AC
  Filled 2014-04-19: qty 40

## 2014-04-19 MED ORDER — REMIFENTANIL HCL 1 MG IV SOLR
0.0125 ug/kg/min | INTRAVENOUS | Status: AC
  Administered 2014-04-19: .1 ug/kg/min via INTRAVENOUS
  Filled 2014-04-19: qty 2000

## 2014-04-19 MED ORDER — LIDOCAINE-EPINEPHRINE 1 %-1:100000 IJ SOLN
INTRAMUSCULAR | Status: AC
  Filled 2014-04-19: qty 1

## 2014-04-19 MED ORDER — NEOSTIGMINE METHYLSULFATE 10 MG/10ML IV SOLN
INTRAVENOUS | Status: AC
  Filled 2014-04-19: qty 1

## 2014-04-19 SURGICAL SUPPLY — 60 items
ATTRACTOMAT 16X20 MAGNETIC DRP (DRAPES) ×2 IMPLANT
BALLN PULM 15 16.5 18X75 (BALLOONS)
BALLOON PULM 15 16.5 18X75 (BALLOONS) IMPLANT
BLADE SCALPEL SHAW  SZ15 (BLADE) IMPLANT
BNDG ADH 5X3 H2O RPLNT NS (GAUZE/BANDAGES/DRESSINGS)
BNDG COHESIVE 3X5 WHT NS (GAUZE/BANDAGES/DRESSINGS) ×1 IMPLANT
BNDG GAUZE ELAST 4 BULKY (GAUZE/BANDAGES/DRESSINGS) ×1 IMPLANT
CANISTER SUCTION 2500CC (MISCELLANEOUS) ×3 IMPLANT
CLEANER TIP ELECTROSURG 2X2 (MISCELLANEOUS) ×3 IMPLANT
COVER SURGICAL LIGHT HANDLE (MISCELLANEOUS) ×3 IMPLANT
COVER TABLE BACK 60X90 (DRAPES) ×3 IMPLANT
CRADLE DONUT ADULT HEAD (MISCELLANEOUS) ×2 IMPLANT
CUTTER LINEAR ENDO 35 ETS (STAPLE) ×2 IMPLANT
DRAIN PENROSE 1/4X12 LTX STRL (WOUND CARE) ×3 IMPLANT
DRAPE PROXIMA HALF (DRAPES) ×3 IMPLANT
DRSG EMULSION OIL 3X3 NADH (GAUZE/BANDAGES/DRESSINGS) ×1 IMPLANT
ELECT COATED BLADE 2.86 ST (ELECTRODE) ×3 IMPLANT
ELECT REM PT RETURN 9FT ADLT (ELECTROSURGICAL) ×3
ELECTRODE REM PT RTRN 9FT ADLT (ELECTROSURGICAL) ×2 IMPLANT
GAUZE SPONGE 4X4 12PLY STRL (GAUZE/BANDAGES/DRESSINGS) ×1 IMPLANT
GLOVE BIOGEL PI IND STRL 7.0 (GLOVE) ×1 IMPLANT
GLOVE BIOGEL PI INDICATOR 7.0 (GLOVE) ×1
GLOVE ECLIPSE 8.0 STRL XLNG CF (GLOVE) ×6 IMPLANT
GLOVE SURG SS PI 7.0 STRL IVOR (GLOVE) ×2 IMPLANT
GOWN BRE IMP SLV AUR LG STRL (GOWN DISPOSABLE) IMPLANT
GOWN STRL REUS W/ TWL LRG LVL3 (GOWN DISPOSABLE) ×4 IMPLANT
GOWN STRL REUS W/ TWL XL LVL3 (GOWN DISPOSABLE) ×1 IMPLANT
GOWN STRL REUS W/TWL LRG LVL3 (GOWN DISPOSABLE) ×6
GOWN STRL REUS W/TWL XL LVL3 (GOWN DISPOSABLE) ×3
GUARD TEETH (MISCELLANEOUS) ×3 IMPLANT
IMPLANT SILICONE 281 (Ophthalmic Related) ×3 IMPLANT
KIT BASIN OR (CUSTOM PROCEDURE TRAY) ×3 IMPLANT
KIT ROOM TURNOVER OR (KITS) ×3 IMPLANT
LOOP VESSEL MAXI BLUE (MISCELLANEOUS) ×3 IMPLANT
NDL HYPO 25GX1X1/2 BEV (NEEDLE) IMPLANT
NEEDLE HYPO 25GX1X1/2 BEV (NEEDLE) ×3 IMPLANT
NS IRRIG 1000ML POUR BTL (IV SOLUTION) ×3 IMPLANT
PAD ARMBOARD 7.5X6 YLW CONV (MISCELLANEOUS) ×4 IMPLANT
PATTIES SURGICAL .5 X3 (DISPOSABLE) IMPLANT
PATTIES SURGICAL 1X1 (DISPOSABLE) IMPLANT
PENCIL BUTTON HOLSTER BLD 10FT (ELECTRODE) ×3 IMPLANT
SOLUTION ANTI FOG 6CC (MISCELLANEOUS) ×2 IMPLANT
SPONGE INTESTINAL PEANUT (DISPOSABLE) IMPLANT
STAPLER PROXIMATE FIRING4 30MM (STAPLE) IMPLANT
STAPLER VISISTAT 35W (STAPLE) ×3 IMPLANT
SURGILUBE 2OZ TUBE FLIPTOP (MISCELLANEOUS) ×3 IMPLANT
SUT CHROMIC 3 0 SH 27 (SUTURE) IMPLANT
SUT CHROMIC 4 0 P 3 18 (SUTURE) ×3 IMPLANT
SUT CHROMIC 4 0 TIES (SUTURE) ×3 IMPLANT
SUT ETHILON 5 0 PS 2 18 (SUTURE) ×3 IMPLANT
SUT SILK 3 0 TIES 10X30 (SUTURE) ×3 IMPLANT
SUT SILK 4 0 (SUTURE) ×3
SUT SILK 4-0 18XBRD TIE 12 (SUTURE) ×2 IMPLANT
TAPE CLOTH SURG 4X10 WHT LF (GAUZE/BANDAGES/DRESSINGS) ×1 IMPLANT
TOWEL OR 17X24 6PK STRL BLUE (TOWEL DISPOSABLE) ×4 IMPLANT
TOWEL OR 17X26 10 PK STRL BLUE (TOWEL DISPOSABLE) ×3 IMPLANT
TRAP SPECIMEN MUCOUS 40CC (MISCELLANEOUS) IMPLANT
TRAY ENT MC OR (CUSTOM PROCEDURE TRAY) ×3 IMPLANT
TUBE CONNECTING 12X1/4 (SUCTIONS) ×3 IMPLANT
TUBE FEEDING 10FR FLEXIFLO (MISCELLANEOUS) IMPLANT

## 2014-04-19 NOTE — Anesthesia Preprocedure Evaluation (Addendum)
Anesthesia Evaluation  Patient identified by MRN, date of birth, ID band Patient awake    Reviewed: H&P   Airway Mallampati: II       Dental   Pulmonary shortness of breath, pneumonia -, former smoker,  breath sounds clear to auscultation        Cardiovascular hypertension, + CAD + dysrhythmias Rhythm:Regular Rate:Normal     Neuro/Psych    GI/Hepatic Neg liver ROS, hiatal hernia, GERD-  ,  Endo/Other  diabetes  Renal/GU negative Renal ROS     Musculoskeletal   Abdominal   Peds  Hematology   Anesthesia Other Findings   Reproductive/Obstetrics                            Anesthesia Physical Anesthesia Plan  ASA: III  Anesthesia Plan: General   Post-op Pain Management:    Induction: Intravenous  Airway Management Planned: Oral ETT  Additional Equipment:   Intra-op Plan:   Post-operative Plan: Possible Post-op intubation/ventilation  Informed Consent: I have reviewed the patients History and Physical, chart, labs and discussed the procedure including the risks, benefits and alternatives for the proposed anesthesia with the patient or authorized representative who has indicated his/her understanding and acceptance.     Plan Discussed with: CRNA and Anesthesiologist  Anesthesia Plan Comments:         Anesthesia Quick Evaluation

## 2014-04-19 NOTE — Discharge Instructions (Signed)
Advance diet slowly as comfortable. Call for breathing difficulty, excessive pain, fever.  [phone].

## 2014-04-19 NOTE — H&P (View-Only) (Signed)
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  She has been having swallowing issues for several years gradually worse.  She is now limited to very soft diet and liquids.  She has to pressure pills and exam with applesauce and still very slow to swallow to she has a very long history of reflux for which she has been using Zantac twice daily for some time.  In the past 2-1/2 years, she has lost 100 pounds.  It is unclear if this is just from swallowing issues.  She is very slow to be very careful.  She brings up stomach acid regularly, but only rarely undigested food.  She was evaluated by Dr. Britta Mccreedy, gastroenterologist, in [place] who  performed upper GI endoscopy and dilatation.  This basically gave her no relief.  She has recently seen lower pulmonary for evaluation of a spot on her lung seen on chest x-ray.  This was felt to be a possible scar from pneumonia, possibly aspiration.  A barium swallow one year ago did show an esophageal diverticulum above the cricopharyngeus.  Plans were started to have her seen at [facility] gastroenterology for an endoscopic approach to her Zenker's diverticulum.  She was sent here instead by Dr. [name].  Her diabetes is very much better after losing weight.  She does not smoke.   We decided to proceed with an endoscopic diverticulotomy later this week.  I discussed the surgery in detail including risks and complications.  Questions were answered and informed consent was obtained.  I discussed the hospital course, typically overnight, and advancement of diet and activity.  I gave her prescriptions for liquid hydrocodone and for Prilosec.  PMH: Past Medical History  Diagnosis Date  . Hyperlipidemia   . HTN (hypertension)   . Diabetes   . GERD (gastroesophageal reflux disease)   . Gout   . Obesity   . Anxiety   . Colon  polyps   . Hiatal hernia   . Depression   . Status post dilation of esophageal narrowing   . DVT (deep venous thrombosis)   . Pneumonia   . Tachycardia     Surg Hx: Past Surgical History  Procedure Laterality Date  . Total abdominal hysterectomy    . Appendectomy    . Breast cyst excision Left     FHx:   Family History  Problem Relation Age of Onset  . Diabetes Son     x 2 sons   SocHx:  reports that she quit smoking about 19 years ago. Her smoking use included Cigarettes. She has a 25 pack-year smoking history. She has never used smokeless tobacco. She reports that she does not drink alcohol or use illicit drugs.  ALLERGIES:  Allergies  Allergen Reactions  . Ace Inhibitors Cough  . Diovan [Valsartan]     Headache   . Zocor [Simvastatin]     Muscle pain     (Not in a hospital admission)  No results found for this or any previous visit (from the past 48 hour(s)). No results found.  QJJ:HERDEYCX: Not feeling tired (fatigue).  No fever, no night sweats, and no recent weight loss. Head: No headache. Eyes: No eye symptoms. Otolaryngeal: No hearing loss, no earache, no tinnitus, and no purulent nasal discharge.  No nasal passage blockage (stuffiness), no snoring, no sneezing, no hoarseness, and no sore throat. Cardiovascular: No  chest pain or discomfort  and no palpitations. Pulmonary: No dyspnea, no cough, and no wheezing. Gastrointestinal: No dysphagia.  Heartburn.  No nausea.  Abdominal pain.  No melena.  No diarrhea. Genitourinary: No dysuria. Endocrine: No muscle weakness. Musculoskeletal: No calf muscle cramps, no arthralgias, and no soft tissue swelling. Neurological: No dizziness, no fainting, no tingling, and no numbness. Psychological: Anxiety  and depression. Skin: No rash.  BP:110/59,  HR: 86 b/min,  Height: 5 ft 5.5 in, Weight: 169 lb , BMI: 27.7 kg/m2,   PHYSICAL EXAM: She is animated and not distressed.  Mental status seems appropriate.  Her voice  is occasionally moist and she coughs and clears her throat occasionally.  No breathing difficulty.  The head is atraumatic and neck supple.  Cranial nerves intact.  Ear canals are clear with normal drums.  Anterior nose is moist and patent.  Oral cavity is moist with teeth in good repair.  Oropharynx clear.  Hypopharynx was reasonably well seen with a mirror examination.  Vocal cords are fully mobile.  There is some pooling in the post cricoid hypopharynx.  No specific lesions visualized.  Neck without adenopathy.   Lungs: Clear to auscultation Heart: Regular rate and rhythm without murmurs Abdomen: Soft, active Extremities: Normal configuration Neurologic: Symmetric, grossly intact.  Studies Reviewed:  ba swallow    Assessment/Plan Acid reflux (530.81) (K21.9). 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  You have an esophageal diverticulum which is interfering with swallowing, causing weight loss and even possibly pneumonia.  We will do an endoscopic approach thtough the mouth which should be fairly easy and quite successful.  In the meantime, take Prilosec twice daily instead of ranitidine.  stop your baby aspirin for 10 days  before surgery.  I will see you back here right before surgery.  Omeprazole 20 MG Oral Capsule Delayed Release;TAKE 1 CAPSULE TWICE DAILY; Qty60; R3; Rx. Hydrocodone-Acetaminophen 7.5-325 MG/15ML Oral Solution;2-3 tsp po q4h prn pain; [id number]; R0; Rx  [name] 49/11/261, 9:41 PM

## 2014-04-19 NOTE — Anesthesia Postprocedure Evaluation (Signed)
  Anesthesia Post-op Note  Patient: Courtney Madden  Procedure(s) Performed: Procedure(s): ENDOSCOPY REPAIR ZENKER'S DIVERTICULECTOMY (N/A) ESOPHAGOSCOPY (N/A)  Patient Location: PACU  Anesthesia Type:General  Level of Consciousness: awake  Airway and Oxygen Therapy: Patient Spontanous Breathing  Post-op Pain: mild  Post-op Assessment: Post-op Vital signs reviewed  Post-op Vital Signs: Reviewed  Last Vitals:  Filed Vitals:   04/19/14 1605  BP:   Pulse: 62  Temp:   Resp: 16    Complications: No apparent anesthesia complications

## 2014-04-19 NOTE — Transfer of Care (Signed)
Immediate Anesthesia Transfer of Care Note  Patient: Courtney Madden  Procedure(s) Performed: Procedure(s): ENDOSCOPY REPAIR ZENKER'S DIVERTICULECTOMY (N/A) ESOPHAGOSCOPY (N/A)  Patient Location: PACU  Anesthesia Type:General  Level of Consciousness: awake, alert  and oriented  Airway & Oxygen Therapy: Patient Spontanous Breathing and Patient connected to nasal cannula oxygen  Post-op Assessment: Report given to PACU RN and Post -op Vital signs reviewed and stable  Post vital signs: Reviewed and stable  Complications: No apparent anesthesia complications

## 2014-04-19 NOTE — Interval H&P Note (Signed)
History and Physical Interval Note:  04/19/2014 2:32 PM  Courtney Madden  has presented today for surgery, with the diagnosis of zinker's esphogeal diverticulum  The various methods of treatment have been discussed with the patient and family. After consideration of risks, benefits and other options for treatment, the patient has consented to  Procedure(s): ENDOSCOPY REPAIR ZENKER'S DIVERTICULECTOMY (N/A) DIRECT LARYNGOSCOPY (N/A) ESOPHAGOSCOPY (N/A) as a surgical intervention .  The patient's history has been re-reviewed, patient re-examined, no change in status, stable for surgery.  I have re-reviewed the patient's chart and labs.  Questions were answered to the patient's satisfaction.     [name]

## 2014-04-19 NOTE — Op Note (Signed)
04/19/2014  3:33 PM    Courtney Madden  [id number]   Pre-Op Dx:  Esophageal diverticulum (Zenker's) with dysphagia.  Post-op Dx: same  Proc: direct laryngoscopy, esophagoscopy, endoscopic esophageal diverticulectomy   Surg:  [name] T MD  Anes:  GOT  EBL:  minimal  Comp:  none  Findings:  Residual food products in a moderately small but inflamed diverticulum pocket. Narrow cricopharyngeus but no evidence of tumor.  Procedure: with the patient in a comfortable supine position, general orotracheal anesthesia was induced without difficulty. At an appropriate level, the table was turned 90 the patient placed in a slight reverse Trendelenburg. A rubber tooth guard was applied to the upper alveolus.  The direct laryngoscope was introduced and inspection of the hypopharynx endolarynx and base of tongue was performed with the findings as described above.  The long narrow pediatric/adult esophagoscope was introduced into the postcricoid area. The diverticulum was easily identified and food products were evacuated.  I could not successfully pass the scope through the cricopharyngeus into the esophagus proper.  The esophagoscope was removed. The direct laryngoscope was reintroduced. Under direct vision in the hypopharynx, using the wire handled bougie dilators, an 40 French dilator was passed easily into the esophagus.  The laryngoscope was removed from over the dilator. The esophagoscope was introduced over the dilator placed at the cricopharyngeus and with gentle pressure, the cricopharyngeus was dilated and the esophagoscope was passed into the cervical esophagus. The mucosa looked healthy. No specific lesions identified. The esophagoscope was removed with the dilator remaining in place.  The Weerda esophagoscope was introduced into the hypopharynx, expanded for visualization, and suspended in the standard fashion. The findings were as described above. A 0 rigid endoscope was used  to inspect the hypopharynx. True esophageal lumen and the diverticulum lumen were identified, and between them the common party wall. With the dilator still in place, the endoscopic stapler was placed with a long talk down the esophagus and the short jaw into the diverticulum. This was oriented in the midline and clamped.  the dilator was removed. Location was ascertained with the endoscope. The stapler was fired and then removed without difficulty. Under direct vision, a successful row of staples on each side was noted.  Using the suction tip as a probe, the esophageal lumen and the diverticulum lumen were in communication.  Hemostasis was observed. The esophagoscope was passed into the esophageal lumen easily.  At this point all scopes and the rubber tooth guard were removed. Patient was returned to anesthesia, awakened, extubated, and transferred to recovery in stable condition.  Dispo:   PACU to 23 hour overnight observation.  Plan:  We'll advance diet gradually.  Anticipate discharge in the morning.Tyson Alias MD

## 2014-04-20 DIAGNOSIS — I498 Other specified cardiac arrhythmias: Secondary | ICD-10-CM | POA: Diagnosis not present

## 2014-04-20 DIAGNOSIS — K225 Diverticulum of esophagus, acquired: Secondary | ICD-10-CM | POA: Diagnosis not present

## 2014-04-20 DIAGNOSIS — I440 Atrioventricular block, first degree: Secondary | ICD-10-CM | POA: Diagnosis not present

## 2014-04-20 DIAGNOSIS — R1319 Other dysphagia: Secondary | ICD-10-CM | POA: Diagnosis not present

## 2014-04-20 NOTE — Discharge Summary (Signed)
04/20/2014 11:15 AM  [name] [id number]  Post-Op Day 1 Discharge Summary    Temp:  [98.1 F (36.7 C)-98.6 F (37 C)] 98.2 F (36.8 C) (12/12 0454) Pulse Rate:  [54-83] 61 (12/12 0454) Resp:  [4-20] 20 (12/12 0454) BP: (98-139)/(48-85) 118/63 mmHg (12/12 0454) SpO2:  [98 %-100 %] 99 % (12/12 0454) Weight:  [76.204 kg (168 lb)] 76.204 kg (168 lb) (12/11 1147),     Intake/Output Summary (Last 24 hours) at 04/20/14 1115 Last data filed at 04/20/14 0459  Gross per 24 hour  Intake   1350 ml  Output      0 ml  Net   1350 ml    Results for orders placed or performed during the hospital encounter of 04/19/14 (from the past 24 hour(s))  Basic metabolic panel     Status: Abnormal   Collection Time: 04/19/14 11:40 AM  Result Value Ref Range   Sodium 143 137 - 147 mEq/L   Potassium 4.2 3.7 - 5.3 mEq/L   Chloride 104 96 - 112 mEq/L   CO2 25 19 - 32 mEq/L   Glucose, Bld 106 (H) 70 - 99 mg/dL   BUN 21 6 - 23 mg/dL   Creatinine, Ser 1.60 (H) 0.50 - 1.10 mg/dL   Calcium 9.9 8.4 - 10.5 mg/dL   GFR calc non Af Amer 30 (L) >90 mL/min   GFR calc Af Amer 35 (L) >90 mL/min   Anion gap 14 5 - 15  CBC     Status: None   Collection Time: 04/19/14 11:40 AM  Result Value Ref Range   WBC 9.6 4.0 - 10.5 K/uL   RBC 4.17 3.87 - 5.11 MIL/uL   Hemoglobin 13.7 12.0 - 15.0 g/dL   HCT 41.2 36.0 - 46.0 %   MCV 98.8 78.0 - 100.0 fL   MCH 32.9 26.0 - 34.0 pg   MCHC 33.3 30.0 - 36.0 g/dL   RDW 14.2 11.5 - 15.5 %   Platelets 262 150 - 400 K/uL  Glucose, capillary     Status: None   Collection Time: 04/19/14 11:49 AM  Result Value Ref Range   Glucose-Capillary 94 70 - 99 mg/dL  Glucose, capillary     Status: Abnormal   Collection Time: 04/19/14  1:45 PM  Result Value Ref Range   Glucose-Capillary 69 (L) 70 - 99 mg/dL  Glucose, capillary     Status: None   Collection Time: 04/19/14  3:39 PM  Result Value Ref Range   Glucose-Capillary 89 70 - 99 mg/dL    SUBJECTIVE:  Pain last  evening but OK this AM. Already swallowing better.  No breathing issues.  Spont void.  No bleeding.  OBJECTIVE:  Voice strong and clear.  Not coughing or clearing her throat.  Breathing easily.  Neck flat and non tender.  No crepitus  IMPRESSION:  Satisfactory check  PLAN:  Discharge home.  Po analgesics.  Advance diet as tol.  Cont. Reflux management.  Admit:  11 DEC Discharge:  12 DEC Final Diagnosis:  Zenker's esophageal diverticulum Proc:  DL, Esophagoscopy, endoscopic diverticulectomy, 11 DEC Cond:  Ambulatory, taking po liquids without difficulty.  Pain controlled Recheck :  2 weeks Instructions written and given  Hosp Course:  Underwent surgery without difficulty.  Mod pain afterwards, already better by POD 1.  Breathing well.  Taking liquids.  Voice clear.  Discharged to home and care of family.  Has liquid hydrocodone prescriptions at home.    Courtney Madden

## 2014-04-20 NOTE — Discharge Planning (Signed)
Copy of AVS to pt who verbalizes understanding. Discharged ambulatory to private car home with family at 30.

## 2014-04-22 ENCOUNTER — Encounter (HOSPITAL_COMMUNITY): Payer: Self-pay | Admitting: Otolaryngology

## 2014-05-29 ENCOUNTER — Encounter: Payer: Self-pay | Admitting: Gastroenterology

## 2014-06-04 DIAGNOSIS — N184 Chronic kidney disease, stage 4 (severe): Secondary | ICD-10-CM | POA: Diagnosis not present

## 2014-06-04 DIAGNOSIS — E1122 Type 2 diabetes mellitus with diabetic chronic kidney disease: Secondary | ICD-10-CM | POA: Diagnosis not present

## 2014-06-04 DIAGNOSIS — K210 Gastro-esophageal reflux disease with esophagitis: Secondary | ICD-10-CM | POA: Diagnosis not present

## 2014-06-04 DIAGNOSIS — I10 Essential (primary) hypertension: Secondary | ICD-10-CM | POA: Diagnosis not present

## 2014-06-04 DIAGNOSIS — E782 Mixed hyperlipidemia: Secondary | ICD-10-CM | POA: Diagnosis not present

## 2014-06-11 DIAGNOSIS — E782 Mixed hyperlipidemia: Secondary | ICD-10-CM | POA: Diagnosis not present

## 2014-06-11 DIAGNOSIS — M1000 Idiopathic gout, unspecified site: Secondary | ICD-10-CM | POA: Diagnosis not present

## 2014-06-11 DIAGNOSIS — E1122 Type 2 diabetes mellitus with diabetic chronic kidney disease: Secondary | ICD-10-CM | POA: Diagnosis not present

## 2014-06-11 DIAGNOSIS — Z1389 Encounter for screening for other disorder: Secondary | ICD-10-CM | POA: Diagnosis not present

## 2014-06-11 DIAGNOSIS — G6289 Other specified polyneuropathies: Secondary | ICD-10-CM | POA: Diagnosis not present

## 2014-06-26 DIAGNOSIS — M1712 Unilateral primary osteoarthritis, left knee: Secondary | ICD-10-CM | POA: Diagnosis not present

## 2014-06-26 DIAGNOSIS — S83512A Sprain of anterior cruciate ligament of left knee, initial encounter: Secondary | ICD-10-CM | POA: Diagnosis not present

## 2014-09-10 DIAGNOSIS — N184 Chronic kidney disease, stage 4 (severe): Secondary | ICD-10-CM | POA: Diagnosis not present

## 2014-09-10 DIAGNOSIS — I10 Essential (primary) hypertension: Secondary | ICD-10-CM | POA: Diagnosis not present

## 2014-09-10 DIAGNOSIS — E782 Mixed hyperlipidemia: Secondary | ICD-10-CM | POA: Diagnosis not present

## 2014-09-10 DIAGNOSIS — E1122 Type 2 diabetes mellitus with diabetic chronic kidney disease: Secondary | ICD-10-CM | POA: Diagnosis not present

## 2014-09-10 DIAGNOSIS — K210 Gastro-esophageal reflux disease with esophagitis: Secondary | ICD-10-CM | POA: Diagnosis not present

## 2014-09-16 DIAGNOSIS — E782 Mixed hyperlipidemia: Secondary | ICD-10-CM | POA: Diagnosis not present

## 2014-09-16 DIAGNOSIS — G6289 Other specified polyneuropathies: Secondary | ICD-10-CM | POA: Diagnosis not present

## 2014-09-16 DIAGNOSIS — E1122 Type 2 diabetes mellitus with diabetic chronic kidney disease: Secondary | ICD-10-CM | POA: Diagnosis not present

## 2014-09-16 DIAGNOSIS — M1000 Idiopathic gout, unspecified site: Secondary | ICD-10-CM | POA: Diagnosis not present

## 2014-09-17 DIAGNOSIS — Z1231 Encounter for screening mammogram for malignant neoplasm of breast: Secondary | ICD-10-CM | POA: Diagnosis not present

## 2014-10-14 DIAGNOSIS — J029 Acute pharyngitis, unspecified: Secondary | ICD-10-CM | POA: Diagnosis not present

## 2014-10-14 DIAGNOSIS — J209 Acute bronchitis, unspecified: Secondary | ICD-10-CM | POA: Diagnosis not present

## 2014-11-15 DIAGNOSIS — R600 Localized edema: Secondary | ICD-10-CM | POA: Diagnosis not present

## 2014-12-13 DIAGNOSIS — E1122 Type 2 diabetes mellitus with diabetic chronic kidney disease: Secondary | ICD-10-CM | POA: Diagnosis not present

## 2014-12-13 DIAGNOSIS — I10 Essential (primary) hypertension: Secondary | ICD-10-CM | POA: Diagnosis not present

## 2014-12-13 DIAGNOSIS — E782 Mixed hyperlipidemia: Secondary | ICD-10-CM | POA: Diagnosis not present

## 2014-12-13 DIAGNOSIS — K210 Gastro-esophageal reflux disease with esophagitis: Secondary | ICD-10-CM | POA: Diagnosis not present

## 2014-12-20 DIAGNOSIS — E1122 Type 2 diabetes mellitus with diabetic chronic kidney disease: Secondary | ICD-10-CM | POA: Diagnosis not present

## 2014-12-20 DIAGNOSIS — G6289 Other specified polyneuropathies: Secondary | ICD-10-CM | POA: Diagnosis not present

## 2014-12-20 DIAGNOSIS — E782 Mixed hyperlipidemia: Secondary | ICD-10-CM | POA: Diagnosis not present

## 2014-12-20 DIAGNOSIS — E1165 Type 2 diabetes mellitus with hyperglycemia: Secondary | ICD-10-CM | POA: Diagnosis not present

## 2014-12-20 DIAGNOSIS — M1000 Idiopathic gout, unspecified site: Secondary | ICD-10-CM | POA: Diagnosis not present

## 2015-01-06 DIAGNOSIS — N184 Chronic kidney disease, stage 4 (severe): Secondary | ICD-10-CM | POA: Diagnosis not present

## 2015-01-30 DIAGNOSIS — Z23 Encounter for immunization: Secondary | ICD-10-CM | POA: Diagnosis not present

## 2015-02-15 DIAGNOSIS — E1165 Type 2 diabetes mellitus with hyperglycemia: Secondary | ICD-10-CM | POA: Diagnosis not present

## 2015-02-15 DIAGNOSIS — F331 Major depressive disorder, recurrent, moderate: Secondary | ICD-10-CM | POA: Diagnosis not present

## 2015-03-03 DIAGNOSIS — J206 Acute bronchitis due to rhinovirus: Secondary | ICD-10-CM | POA: Diagnosis not present

## 2015-04-04 DIAGNOSIS — E1122 Type 2 diabetes mellitus with diabetic chronic kidney disease: Secondary | ICD-10-CM | POA: Diagnosis not present

## 2015-04-04 DIAGNOSIS — I10 Essential (primary) hypertension: Secondary | ICD-10-CM | POA: Diagnosis not present

## 2015-04-04 DIAGNOSIS — N184 Chronic kidney disease, stage 4 (severe): Secondary | ICD-10-CM | POA: Diagnosis not present

## 2015-04-04 DIAGNOSIS — K210 Gastro-esophageal reflux disease with esophagitis: Secondary | ICD-10-CM | POA: Diagnosis not present

## 2015-04-04 DIAGNOSIS — E782 Mixed hyperlipidemia: Secondary | ICD-10-CM | POA: Diagnosis not present

## 2015-04-11 DIAGNOSIS — M1000 Idiopathic gout, unspecified site: Secondary | ICD-10-CM | POA: Diagnosis not present

## 2015-04-11 DIAGNOSIS — Z0001 Encounter for general adult medical examination with abnormal findings: Secondary | ICD-10-CM | POA: Diagnosis not present

## 2015-04-11 DIAGNOSIS — E1142 Type 2 diabetes mellitus with diabetic polyneuropathy: Secondary | ICD-10-CM | POA: Diagnosis not present

## 2015-04-11 DIAGNOSIS — E782 Mixed hyperlipidemia: Secondary | ICD-10-CM | POA: Diagnosis not present

## 2015-04-11 DIAGNOSIS — E1122 Type 2 diabetes mellitus with diabetic chronic kidney disease: Secondary | ICD-10-CM | POA: Diagnosis not present

## 2015-05-01 DIAGNOSIS — J40 Bronchitis, not specified as acute or chronic: Secondary | ICD-10-CM | POA: Diagnosis not present

## 2015-08-04 DIAGNOSIS — E782 Mixed hyperlipidemia: Secondary | ICD-10-CM | POA: Diagnosis not present

## 2015-08-04 DIAGNOSIS — I10 Essential (primary) hypertension: Secondary | ICD-10-CM | POA: Diagnosis not present

## 2015-08-04 DIAGNOSIS — E1122 Type 2 diabetes mellitus with diabetic chronic kidney disease: Secondary | ICD-10-CM | POA: Diagnosis not present

## 2015-08-11 DIAGNOSIS — J111 Influenza due to unidentified influenza virus with other respiratory manifestations: Secondary | ICD-10-CM | POA: Diagnosis not present

## 2015-08-13 DIAGNOSIS — M1000 Idiopathic gout, unspecified site: Secondary | ICD-10-CM | POA: Diagnosis not present

## 2015-08-13 DIAGNOSIS — E1122 Type 2 diabetes mellitus with diabetic chronic kidney disease: Secondary | ICD-10-CM | POA: Diagnosis not present

## 2015-08-13 DIAGNOSIS — E1142 Type 2 diabetes mellitus with diabetic polyneuropathy: Secondary | ICD-10-CM | POA: Diagnosis not present

## 2015-08-13 DIAGNOSIS — E782 Mixed hyperlipidemia: Secondary | ICD-10-CM | POA: Diagnosis not present

## 2015-09-26 DIAGNOSIS — Z1231 Encounter for screening mammogram for malignant neoplasm of breast: Secondary | ICD-10-CM | POA: Diagnosis not present

## 2015-11-10 DIAGNOSIS — I10 Essential (primary) hypertension: Secondary | ICD-10-CM | POA: Diagnosis not present

## 2015-11-10 DIAGNOSIS — K219 Gastro-esophageal reflux disease without esophagitis: Secondary | ICD-10-CM | POA: Diagnosis not present

## 2015-11-10 DIAGNOSIS — E782 Mixed hyperlipidemia: Secondary | ICD-10-CM | POA: Diagnosis not present

## 2015-11-10 DIAGNOSIS — E1165 Type 2 diabetes mellitus with hyperglycemia: Secondary | ICD-10-CM | POA: Diagnosis not present

## 2015-11-10 DIAGNOSIS — N184 Chronic kidney disease, stage 4 (severe): Secondary | ICD-10-CM | POA: Diagnosis not present

## 2015-11-14 DIAGNOSIS — M1000 Idiopathic gout, unspecified site: Secondary | ICD-10-CM | POA: Diagnosis not present

## 2015-11-14 DIAGNOSIS — E782 Mixed hyperlipidemia: Secondary | ICD-10-CM | POA: Diagnosis not present

## 2015-11-14 DIAGNOSIS — E1142 Type 2 diabetes mellitus with diabetic polyneuropathy: Secondary | ICD-10-CM | POA: Diagnosis not present

## 2015-11-14 DIAGNOSIS — E1122 Type 2 diabetes mellitus with diabetic chronic kidney disease: Secondary | ICD-10-CM | POA: Diagnosis not present

## 2016-02-11 DIAGNOSIS — E782 Mixed hyperlipidemia: Secondary | ICD-10-CM | POA: Diagnosis not present

## 2016-02-11 DIAGNOSIS — E1122 Type 2 diabetes mellitus with diabetic chronic kidney disease: Secondary | ICD-10-CM | POA: Diagnosis not present

## 2016-02-11 DIAGNOSIS — I10 Essential (primary) hypertension: Secondary | ICD-10-CM | POA: Diagnosis not present

## 2016-02-11 DIAGNOSIS — E1165 Type 2 diabetes mellitus with hyperglycemia: Secondary | ICD-10-CM | POA: Diagnosis not present

## 2016-02-18 DIAGNOSIS — E1122 Type 2 diabetes mellitus with diabetic chronic kidney disease: Secondary | ICD-10-CM | POA: Diagnosis not present

## 2016-02-18 DIAGNOSIS — Z23 Encounter for immunization: Secondary | ICD-10-CM | POA: Diagnosis not present

## 2016-02-18 DIAGNOSIS — E782 Mixed hyperlipidemia: Secondary | ICD-10-CM | POA: Diagnosis not present

## 2016-02-18 DIAGNOSIS — E1142 Type 2 diabetes mellitus with diabetic polyneuropathy: Secondary | ICD-10-CM | POA: Diagnosis not present

## 2016-02-18 DIAGNOSIS — M1000 Idiopathic gout, unspecified site: Secondary | ICD-10-CM | POA: Diagnosis not present

## 2016-02-18 DIAGNOSIS — K219 Gastro-esophageal reflux disease without esophagitis: Secondary | ICD-10-CM | POA: Diagnosis not present

## 2016-02-19 DIAGNOSIS — E1122 Type 2 diabetes mellitus with diabetic chronic kidney disease: Secondary | ICD-10-CM | POA: Diagnosis not present

## 2016-02-19 DIAGNOSIS — R300 Dysuria: Secondary | ICD-10-CM | POA: Diagnosis not present

## 2016-06-05 DIAGNOSIS — M109 Gout, unspecified: Secondary | ICD-10-CM | POA: Diagnosis not present

## 2016-06-05 DIAGNOSIS — K5730 Diverticulosis of large intestine without perforation or abscess without bleeding: Secondary | ICD-10-CM | POA: Diagnosis not present

## 2016-06-05 DIAGNOSIS — N189 Chronic kidney disease, unspecified: Secondary | ICD-10-CM | POA: Diagnosis not present

## 2016-06-05 DIAGNOSIS — E1122 Type 2 diabetes mellitus with diabetic chronic kidney disease: Secondary | ICD-10-CM | POA: Diagnosis not present

## 2016-06-05 DIAGNOSIS — R1111 Vomiting without nausea: Secondary | ICD-10-CM | POA: Diagnosis not present

## 2016-06-05 DIAGNOSIS — Z7984 Long term (current) use of oral hypoglycemic drugs: Secondary | ICD-10-CM | POA: Diagnosis not present

## 2016-06-05 DIAGNOSIS — Z79899 Other long term (current) drug therapy: Secondary | ICD-10-CM | POA: Diagnosis not present

## 2016-06-05 DIAGNOSIS — K529 Noninfective gastroenteritis and colitis, unspecified: Secondary | ICD-10-CM | POA: Diagnosis not present

## 2016-06-05 DIAGNOSIS — Z7982 Long term (current) use of aspirin: Secondary | ICD-10-CM | POA: Diagnosis not present

## 2016-06-05 DIAGNOSIS — R1900 Intra-abdominal and pelvic swelling, mass and lump, unspecified site: Secondary | ICD-10-CM | POA: Diagnosis not present

## 2016-06-05 DIAGNOSIS — R1904 Left lower quadrant abdominal swelling, mass and lump: Secondary | ICD-10-CM | POA: Diagnosis not present

## 2016-06-07 DIAGNOSIS — N184 Chronic kidney disease, stage 4 (severe): Secondary | ICD-10-CM | POA: Diagnosis not present

## 2016-06-07 DIAGNOSIS — E1122 Type 2 diabetes mellitus with diabetic chronic kidney disease: Secondary | ICD-10-CM | POA: Diagnosis not present

## 2016-06-07 DIAGNOSIS — I700 Atherosclerosis of aorta: Secondary | ICD-10-CM | POA: Diagnosis not present

## 2016-06-16 DIAGNOSIS — E782 Mixed hyperlipidemia: Secondary | ICD-10-CM | POA: Diagnosis not present

## 2016-06-16 DIAGNOSIS — E1122 Type 2 diabetes mellitus with diabetic chronic kidney disease: Secondary | ICD-10-CM | POA: Diagnosis not present

## 2016-06-16 DIAGNOSIS — M1000 Idiopathic gout, unspecified site: Secondary | ICD-10-CM | POA: Diagnosis not present

## 2016-06-16 DIAGNOSIS — E1142 Type 2 diabetes mellitus with diabetic polyneuropathy: Secondary | ICD-10-CM | POA: Diagnosis not present

## 2016-07-23 DIAGNOSIS — E1142 Type 2 diabetes mellitus with diabetic polyneuropathy: Secondary | ICD-10-CM | POA: Diagnosis not present

## 2016-07-23 DIAGNOSIS — E782 Mixed hyperlipidemia: Secondary | ICD-10-CM | POA: Diagnosis not present

## 2016-07-23 DIAGNOSIS — E1122 Type 2 diabetes mellitus with diabetic chronic kidney disease: Secondary | ICD-10-CM | POA: Diagnosis not present

## 2016-07-23 DIAGNOSIS — E1165 Type 2 diabetes mellitus with hyperglycemia: Secondary | ICD-10-CM | POA: Diagnosis not present

## 2016-07-23 DIAGNOSIS — M1000 Idiopathic gout, unspecified site: Secondary | ICD-10-CM | POA: Diagnosis not present

## 2016-07-29 DIAGNOSIS — E782 Mixed hyperlipidemia: Secondary | ICD-10-CM | POA: Diagnosis not present

## 2016-07-29 DIAGNOSIS — I10 Essential (primary) hypertension: Secondary | ICD-10-CM | POA: Diagnosis not present

## 2016-07-29 DIAGNOSIS — M1712 Unilateral primary osteoarthritis, left knee: Secondary | ICD-10-CM | POA: Diagnosis not present

## 2016-07-29 DIAGNOSIS — M545 Low back pain: Secondary | ICD-10-CM | POA: Diagnosis not present

## 2016-07-29 DIAGNOSIS — E1122 Type 2 diabetes mellitus with diabetic chronic kidney disease: Secondary | ICD-10-CM | POA: Diagnosis not present

## 2016-07-29 DIAGNOSIS — E1142 Type 2 diabetes mellitus with diabetic polyneuropathy: Secondary | ICD-10-CM | POA: Diagnosis not present

## 2016-07-30 DIAGNOSIS — E1122 Type 2 diabetes mellitus with diabetic chronic kidney disease: Secondary | ICD-10-CM | POA: Diagnosis not present

## 2016-07-30 DIAGNOSIS — E1165 Type 2 diabetes mellitus with hyperglycemia: Secondary | ICD-10-CM | POA: Diagnosis not present

## 2016-07-30 DIAGNOSIS — E1142 Type 2 diabetes mellitus with diabetic polyneuropathy: Secondary | ICD-10-CM | POA: Diagnosis not present

## 2016-07-30 DIAGNOSIS — Z5181 Encounter for therapeutic drug level monitoring: Secondary | ICD-10-CM | POA: Diagnosis not present

## 2016-08-30 DIAGNOSIS — I952 Hypotension due to drugs: Secondary | ICD-10-CM | POA: Diagnosis not present

## 2016-08-30 DIAGNOSIS — N3001 Acute cystitis with hematuria: Secondary | ICD-10-CM | POA: Diagnosis not present

## 2016-10-12 DIAGNOSIS — R300 Dysuria: Secondary | ICD-10-CM | POA: Diagnosis not present

## 2016-10-12 DIAGNOSIS — N760 Acute vaginitis: Secondary | ICD-10-CM | POA: Diagnosis not present

## 2016-10-14 DIAGNOSIS — Z7984 Long term (current) use of oral hypoglycemic drugs: Secondary | ICD-10-CM | POA: Diagnosis not present

## 2016-10-14 DIAGNOSIS — Z79899 Other long term (current) drug therapy: Secondary | ICD-10-CM | POA: Diagnosis not present

## 2016-10-14 DIAGNOSIS — Z833 Family history of diabetes mellitus: Secondary | ICD-10-CM | POA: Diagnosis not present

## 2016-10-14 DIAGNOSIS — E119 Type 2 diabetes mellitus without complications: Secondary | ICD-10-CM | POA: Diagnosis not present

## 2016-10-14 DIAGNOSIS — N189 Chronic kidney disease, unspecified: Secondary | ICD-10-CM | POA: Diagnosis not present

## 2016-10-14 DIAGNOSIS — R319 Hematuria, unspecified: Secondary | ICD-10-CM | POA: Diagnosis not present

## 2016-10-14 DIAGNOSIS — R1909 Other intra-abdominal and pelvic swelling, mass and lump: Secondary | ICD-10-CM | POA: Diagnosis not present

## 2016-10-14 DIAGNOSIS — Z7982 Long term (current) use of aspirin: Secondary | ICD-10-CM | POA: Diagnosis not present

## 2016-10-14 DIAGNOSIS — N3289 Other specified disorders of bladder: Secondary | ICD-10-CM | POA: Diagnosis not present

## 2016-10-14 DIAGNOSIS — Z8639 Personal history of other endocrine, nutritional and metabolic disease: Secondary | ICD-10-CM | POA: Diagnosis not present

## 2016-10-14 DIAGNOSIS — R1030 Lower abdominal pain, unspecified: Secondary | ICD-10-CM | POA: Diagnosis not present

## 2016-10-14 DIAGNOSIS — N390 Urinary tract infection, site not specified: Secondary | ICD-10-CM | POA: Diagnosis not present

## 2016-10-17 DIAGNOSIS — Z79899 Other long term (current) drug therapy: Secondary | ICD-10-CM | POA: Diagnosis not present

## 2016-10-17 DIAGNOSIS — E119 Type 2 diabetes mellitus without complications: Secondary | ICD-10-CM | POA: Diagnosis not present

## 2016-10-17 DIAGNOSIS — R319 Hematuria, unspecified: Secondary | ICD-10-CM | POA: Diagnosis not present

## 2016-10-17 DIAGNOSIS — R1084 Generalized abdominal pain: Secondary | ICD-10-CM | POA: Diagnosis not present

## 2016-10-17 DIAGNOSIS — Z833 Family history of diabetes mellitus: Secondary | ICD-10-CM | POA: Diagnosis not present

## 2016-10-17 DIAGNOSIS — N390 Urinary tract infection, site not specified: Secondary | ICD-10-CM | POA: Diagnosis not present

## 2016-10-17 DIAGNOSIS — Z7984 Long term (current) use of oral hypoglycemic drugs: Secondary | ICD-10-CM | POA: Diagnosis not present

## 2016-10-17 DIAGNOSIS — K5900 Constipation, unspecified: Secondary | ICD-10-CM | POA: Diagnosis not present

## 2016-10-17 DIAGNOSIS — R1900 Intra-abdominal and pelvic swelling, mass and lump, unspecified site: Secondary | ICD-10-CM | POA: Diagnosis not present

## 2016-10-17 DIAGNOSIS — Z7982 Long term (current) use of aspirin: Secondary | ICD-10-CM | POA: Diagnosis not present

## 2016-10-17 DIAGNOSIS — Z87891 Personal history of nicotine dependence: Secondary | ICD-10-CM | POA: Diagnosis not present

## 2016-10-17 DIAGNOSIS — Z8639 Personal history of other endocrine, nutritional and metabolic disease: Secondary | ICD-10-CM | POA: Diagnosis not present

## 2016-10-18 DIAGNOSIS — E782 Mixed hyperlipidemia: Secondary | ICD-10-CM | POA: Diagnosis not present

## 2016-10-18 DIAGNOSIS — E1122 Type 2 diabetes mellitus with diabetic chronic kidney disease: Secondary | ICD-10-CM | POA: Diagnosis not present

## 2016-10-18 DIAGNOSIS — E1142 Type 2 diabetes mellitus with diabetic polyneuropathy: Secondary | ICD-10-CM | POA: Diagnosis not present

## 2016-10-18 DIAGNOSIS — M1000 Idiopathic gout, unspecified site: Secondary | ICD-10-CM | POA: Diagnosis not present

## 2016-10-28 DIAGNOSIS — E782 Mixed hyperlipidemia: Secondary | ICD-10-CM | POA: Diagnosis not present

## 2016-10-28 DIAGNOSIS — E1165 Type 2 diabetes mellitus with hyperglycemia: Secondary | ICD-10-CM | POA: Diagnosis not present

## 2016-10-28 DIAGNOSIS — E1122 Type 2 diabetes mellitus with diabetic chronic kidney disease: Secondary | ICD-10-CM | POA: Diagnosis not present

## 2016-10-28 DIAGNOSIS — I10 Essential (primary) hypertension: Secondary | ICD-10-CM | POA: Diagnosis not present

## 2016-10-28 DIAGNOSIS — E1142 Type 2 diabetes mellitus with diabetic polyneuropathy: Secondary | ICD-10-CM | POA: Diagnosis not present

## 2016-11-01 DIAGNOSIS — Z1389 Encounter for screening for other disorder: Secondary | ICD-10-CM | POA: Diagnosis not present

## 2016-11-01 DIAGNOSIS — E782 Mixed hyperlipidemia: Secondary | ICD-10-CM | POA: Diagnosis not present

## 2016-11-01 DIAGNOSIS — M1000 Idiopathic gout, unspecified site: Secondary | ICD-10-CM | POA: Diagnosis not present

## 2016-11-01 DIAGNOSIS — E1142 Type 2 diabetes mellitus with diabetic polyneuropathy: Secondary | ICD-10-CM | POA: Diagnosis not present

## 2016-11-01 DIAGNOSIS — E1122 Type 2 diabetes mellitus with diabetic chronic kidney disease: Secondary | ICD-10-CM | POA: Diagnosis not present

## 2016-11-01 DIAGNOSIS — G6289 Other specified polyneuropathies: Secondary | ICD-10-CM | POA: Diagnosis not present

## 2016-11-29 DIAGNOSIS — N3000 Acute cystitis without hematuria: Secondary | ICD-10-CM | POA: Diagnosis not present

## 2016-12-03 DIAGNOSIS — N3000 Acute cystitis without hematuria: Secondary | ICD-10-CM | POA: Diagnosis not present

## 2016-12-09 DIAGNOSIS — R300 Dysuria: Secondary | ICD-10-CM | POA: Diagnosis not present

## 2016-12-20 DIAGNOSIS — N184 Chronic kidney disease, stage 4 (severe): Secondary | ICD-10-CM | POA: Diagnosis not present

## 2016-12-20 DIAGNOSIS — N3001 Acute cystitis with hematuria: Secondary | ICD-10-CM | POA: Diagnosis not present

## 2016-12-20 DIAGNOSIS — R1032 Left lower quadrant pain: Secondary | ICD-10-CM | POA: Diagnosis not present

## 2016-12-20 DIAGNOSIS — R1900 Intra-abdominal and pelvic swelling, mass and lump, unspecified site: Secondary | ICD-10-CM | POA: Diagnosis not present

## 2016-12-20 DIAGNOSIS — R531 Weakness: Secondary | ICD-10-CM | POA: Diagnosis not present

## 2016-12-21 DIAGNOSIS — N132 Hydronephrosis with renal and ureteral calculous obstruction: Secondary | ICD-10-CM | POA: Diagnosis not present

## 2016-12-21 DIAGNOSIS — R1900 Intra-abdominal and pelvic swelling, mass and lump, unspecified site: Secondary | ICD-10-CM | POA: Diagnosis not present

## 2016-12-21 DIAGNOSIS — N3001 Acute cystitis with hematuria: Secondary | ICD-10-CM | POA: Diagnosis not present

## 2016-12-22 ENCOUNTER — Ambulatory Visit: Payer: Self-pay | Admitting: Urology

## 2016-12-22 DIAGNOSIS — Z7984 Long term (current) use of oral hypoglycemic drugs: Secondary | ICD-10-CM | POA: Diagnosis not present

## 2016-12-22 DIAGNOSIS — L304 Erythema intertrigo: Secondary | ICD-10-CM | POA: Diagnosis not present

## 2016-12-22 DIAGNOSIS — T402X5A Adverse effect of other opioids, initial encounter: Secondary | ICD-10-CM | POA: Diagnosis not present

## 2016-12-22 DIAGNOSIS — M6281 Muscle weakness (generalized): Secondary | ICD-10-CM | POA: Diagnosis not present

## 2016-12-22 DIAGNOSIS — B952 Enterococcus as the cause of diseases classified elsewhere: Secondary | ICD-10-CM | POA: Diagnosis not present

## 2016-12-22 DIAGNOSIS — K219 Gastro-esophageal reflux disease without esophagitis: Secondary | ICD-10-CM | POA: Diagnosis not present

## 2016-12-22 DIAGNOSIS — Z79899 Other long term (current) drug therapy: Secondary | ICD-10-CM | POA: Diagnosis not present

## 2016-12-22 DIAGNOSIS — Z7982 Long term (current) use of aspirin: Secondary | ICD-10-CM | POA: Diagnosis not present

## 2016-12-22 DIAGNOSIS — E785 Hyperlipidemia, unspecified: Secondary | ICD-10-CM | POA: Diagnosis not present

## 2016-12-22 DIAGNOSIS — Z1624 Resistance to multiple antibiotics: Secondary | ICD-10-CM | POA: Diagnosis not present

## 2016-12-22 DIAGNOSIS — E1142 Type 2 diabetes mellitus with diabetic polyneuropathy: Secondary | ICD-10-CM | POA: Diagnosis not present

## 2016-12-22 DIAGNOSIS — K5903 Drug induced constipation: Secondary | ICD-10-CM | POA: Diagnosis not present

## 2016-12-22 DIAGNOSIS — I509 Heart failure, unspecified: Secondary | ICD-10-CM | POA: Diagnosis not present

## 2016-12-22 DIAGNOSIS — E1122 Type 2 diabetes mellitus with diabetic chronic kidney disease: Secondary | ICD-10-CM | POA: Diagnosis not present

## 2016-12-22 DIAGNOSIS — M5136 Other intervertebral disc degeneration, lumbar region: Secondary | ICD-10-CM | POA: Diagnosis not present

## 2016-12-22 DIAGNOSIS — Z48816 Encounter for surgical aftercare following surgery on the genitourinary system: Secondary | ICD-10-CM | POA: Diagnosis not present

## 2016-12-22 DIAGNOSIS — N184 Chronic kidney disease, stage 4 (severe): Secondary | ICD-10-CM | POA: Diagnosis not present

## 2016-12-22 DIAGNOSIS — M1A9XX0 Chronic gout, unspecified, without tophus (tophi): Secondary | ICD-10-CM | POA: Diagnosis not present

## 2016-12-22 DIAGNOSIS — R1900 Intra-abdominal and pelvic swelling, mass and lump, unspecified site: Secondary | ICD-10-CM | POA: Diagnosis not present

## 2016-12-22 DIAGNOSIS — R278 Other lack of coordination: Secondary | ICD-10-CM | POA: Diagnosis not present

## 2016-12-22 DIAGNOSIS — N390 Urinary tract infection, site not specified: Secondary | ICD-10-CM | POA: Diagnosis not present

## 2016-12-22 DIAGNOSIS — R2689 Other abnormalities of gait and mobility: Secondary | ICD-10-CM | POA: Diagnosis not present

## 2016-12-22 DIAGNOSIS — I130 Hypertensive heart and chronic kidney disease with heart failure and stage 1 through stage 4 chronic kidney disease, or unspecified chronic kidney disease: Secondary | ICD-10-CM | POA: Diagnosis not present

## 2016-12-22 DIAGNOSIS — Z87891 Personal history of nicotine dependence: Secondary | ICD-10-CM | POA: Diagnosis not present

## 2016-12-22 DIAGNOSIS — I5032 Chronic diastolic (congestive) heart failure: Secondary | ICD-10-CM | POA: Diagnosis not present

## 2016-12-22 DIAGNOSIS — T40605D Adverse effect of unspecified narcotics, subsequent encounter: Secondary | ICD-10-CM | POA: Diagnosis not present

## 2016-12-22 DIAGNOSIS — Z888 Allergy status to other drugs, medicaments and biological substances status: Secondary | ICD-10-CM | POA: Diagnosis not present

## 2016-12-22 DIAGNOSIS — N3001 Acute cystitis with hematuria: Secondary | ICD-10-CM | POA: Diagnosis not present

## 2016-12-28 DIAGNOSIS — I130 Hypertensive heart and chronic kidney disease with heart failure and stage 1 through stage 4 chronic kidney disease, or unspecified chronic kidney disease: Secondary | ICD-10-CM | POA: Diagnosis not present

## 2016-12-28 DIAGNOSIS — N3001 Acute cystitis with hematuria: Secondary | ICD-10-CM | POA: Diagnosis not present

## 2016-12-28 DIAGNOSIS — M5136 Other intervertebral disc degeneration, lumbar region: Secondary | ICD-10-CM | POA: Diagnosis not present

## 2016-12-28 DIAGNOSIS — E1122 Type 2 diabetes mellitus with diabetic chronic kidney disease: Secondary | ICD-10-CM | POA: Diagnosis not present

## 2016-12-28 DIAGNOSIS — K219 Gastro-esophageal reflux disease without esophagitis: Secondary | ICD-10-CM | POA: Diagnosis not present

## 2016-12-28 DIAGNOSIS — M1A9XX0 Chronic gout, unspecified, without tophus (tophi): Secondary | ICD-10-CM | POA: Diagnosis not present

## 2016-12-28 DIAGNOSIS — R278 Other lack of coordination: Secondary | ICD-10-CM | POA: Diagnosis not present

## 2016-12-28 DIAGNOSIS — R1900 Intra-abdominal and pelvic swelling, mass and lump, unspecified site: Secondary | ICD-10-CM | POA: Diagnosis not present

## 2016-12-28 DIAGNOSIS — N184 Chronic kidney disease, stage 4 (severe): Secondary | ICD-10-CM | POA: Diagnosis not present

## 2016-12-28 DIAGNOSIS — N390 Urinary tract infection, site not specified: Secondary | ICD-10-CM | POA: Diagnosis not present

## 2016-12-28 DIAGNOSIS — R2689 Other abnormalities of gait and mobility: Secondary | ICD-10-CM | POA: Diagnosis not present

## 2016-12-28 DIAGNOSIS — E1142 Type 2 diabetes mellitus with diabetic polyneuropathy: Secondary | ICD-10-CM | POA: Diagnosis not present

## 2016-12-28 DIAGNOSIS — I509 Heart failure, unspecified: Secondary | ICD-10-CM | POA: Diagnosis not present

## 2016-12-28 DIAGNOSIS — B952 Enterococcus as the cause of diseases classified elsewhere: Secondary | ICD-10-CM | POA: Diagnosis not present

## 2016-12-28 DIAGNOSIS — K5903 Drug induced constipation: Secondary | ICD-10-CM | POA: Diagnosis not present

## 2016-12-28 DIAGNOSIS — Z48816 Encounter for surgical aftercare following surgery on the genitourinary system: Secondary | ICD-10-CM | POA: Diagnosis not present

## 2016-12-28 DIAGNOSIS — T40605D Adverse effect of unspecified narcotics, subsequent encounter: Secondary | ICD-10-CM | POA: Diagnosis not present

## 2016-12-28 DIAGNOSIS — M6281 Muscle weakness (generalized): Secondary | ICD-10-CM | POA: Diagnosis not present

## 2016-12-29 DIAGNOSIS — N3001 Acute cystitis with hematuria: Secondary | ICD-10-CM | POA: Diagnosis not present

## 2016-12-29 DIAGNOSIS — R1900 Intra-abdominal and pelvic swelling, mass and lump, unspecified site: Secondary | ICD-10-CM | POA: Diagnosis not present

## 2017-01-08 DIAGNOSIS — N184 Chronic kidney disease, stage 4 (severe): Secondary | ICD-10-CM | POA: Diagnosis not present

## 2017-01-08 DIAGNOSIS — R1900 Intra-abdominal and pelvic swelling, mass and lump, unspecified site: Secondary | ICD-10-CM | POA: Diagnosis not present

## 2017-01-16 DIAGNOSIS — Z483 Aftercare following surgery for neoplasm: Secondary | ICD-10-CM | POA: Diagnosis not present

## 2017-01-16 DIAGNOSIS — E1122 Type 2 diabetes mellitus with diabetic chronic kidney disease: Secondary | ICD-10-CM | POA: Diagnosis not present

## 2017-01-16 DIAGNOSIS — I5032 Chronic diastolic (congestive) heart failure: Secondary | ICD-10-CM | POA: Diagnosis not present

## 2017-01-16 DIAGNOSIS — I132 Hypertensive heart and chronic kidney disease with heart failure and with stage 5 chronic kidney disease, or end stage renal disease: Secondary | ICD-10-CM | POA: Diagnosis not present

## 2017-01-19 DIAGNOSIS — Z23 Encounter for immunization: Secondary | ICD-10-CM | POA: Diagnosis not present

## 2017-01-20 DIAGNOSIS — I132 Hypertensive heart and chronic kidney disease with heart failure and with stage 5 chronic kidney disease, or end stage renal disease: Secondary | ICD-10-CM | POA: Diagnosis not present

## 2017-01-20 DIAGNOSIS — I5032 Chronic diastolic (congestive) heart failure: Secondary | ICD-10-CM | POA: Diagnosis not present

## 2017-01-20 DIAGNOSIS — E1122 Type 2 diabetes mellitus with diabetic chronic kidney disease: Secondary | ICD-10-CM | POA: Diagnosis not present

## 2017-01-20 DIAGNOSIS — Z483 Aftercare following surgery for neoplasm: Secondary | ICD-10-CM | POA: Diagnosis not present

## 2017-01-25 DIAGNOSIS — I132 Hypertensive heart and chronic kidney disease with heart failure and with stage 5 chronic kidney disease, or end stage renal disease: Secondary | ICD-10-CM | POA: Diagnosis not present

## 2017-01-25 DIAGNOSIS — I5032 Chronic diastolic (congestive) heart failure: Secondary | ICD-10-CM | POA: Diagnosis not present

## 2017-01-25 DIAGNOSIS — E1122 Type 2 diabetes mellitus with diabetic chronic kidney disease: Secondary | ICD-10-CM | POA: Diagnosis not present

## 2017-01-25 DIAGNOSIS — Z483 Aftercare following surgery for neoplasm: Secondary | ICD-10-CM | POA: Diagnosis not present

## 2017-01-26 DIAGNOSIS — I132 Hypertensive heart and chronic kidney disease with heart failure and with stage 5 chronic kidney disease, or end stage renal disease: Secondary | ICD-10-CM | POA: Diagnosis not present

## 2017-01-26 DIAGNOSIS — E1122 Type 2 diabetes mellitus with diabetic chronic kidney disease: Secondary | ICD-10-CM | POA: Diagnosis not present

## 2017-01-26 DIAGNOSIS — Z483 Aftercare following surgery for neoplasm: Secondary | ICD-10-CM | POA: Diagnosis not present

## 2017-01-26 DIAGNOSIS — I5032 Chronic diastolic (congestive) heart failure: Secondary | ICD-10-CM | POA: Diagnosis not present

## 2017-01-27 DIAGNOSIS — I132 Hypertensive heart and chronic kidney disease with heart failure and with stage 5 chronic kidney disease, or end stage renal disease: Secondary | ICD-10-CM | POA: Diagnosis not present

## 2017-01-27 DIAGNOSIS — E1122 Type 2 diabetes mellitus with diabetic chronic kidney disease: Secondary | ICD-10-CM | POA: Diagnosis not present

## 2017-01-27 DIAGNOSIS — Z483 Aftercare following surgery for neoplasm: Secondary | ICD-10-CM | POA: Diagnosis not present

## 2017-01-27 DIAGNOSIS — I5032 Chronic diastolic (congestive) heart failure: Secondary | ICD-10-CM | POA: Diagnosis not present

## 2017-01-28 DIAGNOSIS — I132 Hypertensive heart and chronic kidney disease with heart failure and with stage 5 chronic kidney disease, or end stage renal disease: Secondary | ICD-10-CM | POA: Diagnosis not present

## 2017-01-28 DIAGNOSIS — E1122 Type 2 diabetes mellitus with diabetic chronic kidney disease: Secondary | ICD-10-CM | POA: Diagnosis not present

## 2017-01-28 DIAGNOSIS — I5032 Chronic diastolic (congestive) heart failure: Secondary | ICD-10-CM | POA: Diagnosis not present

## 2017-01-28 DIAGNOSIS — Z483 Aftercare following surgery for neoplasm: Secondary | ICD-10-CM | POA: Diagnosis not present

## 2017-01-31 DIAGNOSIS — E782 Mixed hyperlipidemia: Secondary | ICD-10-CM | POA: Diagnosis not present

## 2017-01-31 DIAGNOSIS — E039 Hypothyroidism, unspecified: Secondary | ICD-10-CM | POA: Diagnosis not present

## 2017-01-31 DIAGNOSIS — K219 Gastro-esophageal reflux disease without esophagitis: Secondary | ICD-10-CM | POA: Diagnosis not present

## 2017-01-31 DIAGNOSIS — E1165 Type 2 diabetes mellitus with hyperglycemia: Secondary | ICD-10-CM | POA: Diagnosis not present

## 2017-01-31 DIAGNOSIS — I10 Essential (primary) hypertension: Secondary | ICD-10-CM | POA: Diagnosis not present

## 2017-02-01 DIAGNOSIS — I132 Hypertensive heart and chronic kidney disease with heart failure and with stage 5 chronic kidney disease, or end stage renal disease: Secondary | ICD-10-CM | POA: Diagnosis not present

## 2017-02-01 DIAGNOSIS — Z483 Aftercare following surgery for neoplasm: Secondary | ICD-10-CM | POA: Diagnosis not present

## 2017-02-01 DIAGNOSIS — I5032 Chronic diastolic (congestive) heart failure: Secondary | ICD-10-CM | POA: Diagnosis not present

## 2017-02-01 DIAGNOSIS — E1122 Type 2 diabetes mellitus with diabetic chronic kidney disease: Secondary | ICD-10-CM | POA: Diagnosis not present

## 2017-02-02 DIAGNOSIS — M1000 Idiopathic gout, unspecified site: Secondary | ICD-10-CM | POA: Diagnosis not present

## 2017-02-02 DIAGNOSIS — Z0001 Encounter for general adult medical examination with abnormal findings: Secondary | ICD-10-CM | POA: Diagnosis not present

## 2017-02-02 DIAGNOSIS — E1122 Type 2 diabetes mellitus with diabetic chronic kidney disease: Secondary | ICD-10-CM | POA: Diagnosis not present

## 2017-02-03 DIAGNOSIS — I5032 Chronic diastolic (congestive) heart failure: Secondary | ICD-10-CM | POA: Diagnosis not present

## 2017-02-03 DIAGNOSIS — Z483 Aftercare following surgery for neoplasm: Secondary | ICD-10-CM | POA: Diagnosis not present

## 2017-02-03 DIAGNOSIS — E1122 Type 2 diabetes mellitus with diabetic chronic kidney disease: Secondary | ICD-10-CM | POA: Diagnosis not present

## 2017-02-03 DIAGNOSIS — I132 Hypertensive heart and chronic kidney disease with heart failure and with stage 5 chronic kidney disease, or end stage renal disease: Secondary | ICD-10-CM | POA: Diagnosis not present

## 2017-02-04 DIAGNOSIS — I132 Hypertensive heart and chronic kidney disease with heart failure and with stage 5 chronic kidney disease, or end stage renal disease: Secondary | ICD-10-CM | POA: Diagnosis not present

## 2017-02-04 DIAGNOSIS — Z483 Aftercare following surgery for neoplasm: Secondary | ICD-10-CM | POA: Diagnosis not present

## 2017-02-04 DIAGNOSIS — I5032 Chronic diastolic (congestive) heart failure: Secondary | ICD-10-CM | POA: Diagnosis not present

## 2017-02-04 DIAGNOSIS — E1122 Type 2 diabetes mellitus with diabetic chronic kidney disease: Secondary | ICD-10-CM | POA: Diagnosis not present

## 2017-02-06 DIAGNOSIS — I10 Essential (primary) hypertension: Secondary | ICD-10-CM | POA: Diagnosis not present

## 2017-02-06 DIAGNOSIS — N189 Chronic kidney disease, unspecified: Secondary | ICD-10-CM | POA: Diagnosis not present

## 2017-02-06 DIAGNOSIS — I130 Hypertensive heart and chronic kidney disease with heart failure and stage 1 through stage 4 chronic kidney disease, or unspecified chronic kidney disease: Secondary | ICD-10-CM | POA: Diagnosis not present

## 2017-02-06 DIAGNOSIS — N390 Urinary tract infection, site not specified: Secondary | ICD-10-CM | POA: Diagnosis not present

## 2017-02-06 DIAGNOSIS — G250 Essential tremor: Secondary | ICD-10-CM | POA: Diagnosis not present

## 2017-02-06 DIAGNOSIS — N184 Chronic kidney disease, stage 4 (severe): Secondary | ICD-10-CM | POA: Diagnosis not present

## 2017-02-06 DIAGNOSIS — I700 Atherosclerosis of aorta: Secondary | ICD-10-CM | POA: Diagnosis not present

## 2017-02-06 DIAGNOSIS — N1330 Unspecified hydronephrosis: Secondary | ICD-10-CM | POA: Diagnosis not present

## 2017-02-06 DIAGNOSIS — D72829 Elevated white blood cell count, unspecified: Secondary | ICD-10-CM | POA: Diagnosis not present

## 2017-02-06 DIAGNOSIS — N179 Acute kidney failure, unspecified: Secondary | ICD-10-CM | POA: Diagnosis not present

## 2017-02-06 DIAGNOSIS — E1142 Type 2 diabetes mellitus with diabetic polyneuropathy: Secondary | ICD-10-CM | POA: Diagnosis not present

## 2017-02-06 DIAGNOSIS — Z87891 Personal history of nicotine dependence: Secondary | ICD-10-CM | POA: Diagnosis not present

## 2017-02-06 DIAGNOSIS — E1129 Type 2 diabetes mellitus with other diabetic kidney complication: Secondary | ICD-10-CM | POA: Diagnosis not present

## 2017-02-06 DIAGNOSIS — E876 Hypokalemia: Secondary | ICD-10-CM | POA: Diagnosis not present

## 2017-02-06 DIAGNOSIS — Z833 Family history of diabetes mellitus: Secondary | ICD-10-CM | POA: Diagnosis not present

## 2017-02-06 DIAGNOSIS — E860 Dehydration: Secondary | ICD-10-CM | POA: Diagnosis not present

## 2017-02-06 DIAGNOSIS — M1A9XX0 Chronic gout, unspecified, without tophus (tophi): Secondary | ICD-10-CM | POA: Diagnosis not present

## 2017-02-06 DIAGNOSIS — M5136 Other intervertebral disc degeneration, lumbar region: Secondary | ICD-10-CM | POA: Diagnosis not present

## 2017-02-06 DIAGNOSIS — I509 Heart failure, unspecified: Secondary | ICD-10-CM | POA: Diagnosis not present

## 2017-02-06 DIAGNOSIS — Z483 Aftercare following surgery for neoplasm: Secondary | ICD-10-CM | POA: Diagnosis not present

## 2017-02-06 DIAGNOSIS — I5032 Chronic diastolic (congestive) heart failure: Secondary | ICD-10-CM | POA: Diagnosis not present

## 2017-02-06 DIAGNOSIS — E1122 Type 2 diabetes mellitus with diabetic chronic kidney disease: Secondary | ICD-10-CM | POA: Diagnosis not present

## 2017-02-06 DIAGNOSIS — K5790 Diverticulosis of intestine, part unspecified, without perforation or abscess without bleeding: Secondary | ICD-10-CM | POA: Diagnosis not present

## 2017-02-06 DIAGNOSIS — D631 Anemia in chronic kidney disease: Secondary | ICD-10-CM | POA: Diagnosis not present

## 2017-02-06 DIAGNOSIS — N3001 Acute cystitis with hematuria: Secondary | ICD-10-CM | POA: Diagnosis not present

## 2017-02-06 DIAGNOSIS — B9561 Methicillin susceptible Staphylococcus aureus infection as the cause of diseases classified elsewhere: Secondary | ICD-10-CM | POA: Diagnosis not present

## 2017-02-06 DIAGNOSIS — Z7982 Long term (current) use of aspirin: Secondary | ICD-10-CM | POA: Diagnosis not present

## 2017-02-06 DIAGNOSIS — M898X9 Other specified disorders of bone, unspecified site: Secondary | ICD-10-CM | POA: Diagnosis not present

## 2017-02-06 DIAGNOSIS — N132 Hydronephrosis with renal and ureteral calculous obstruction: Secondary | ICD-10-CM | POA: Diagnosis not present

## 2017-02-06 DIAGNOSIS — I132 Hypertensive heart and chronic kidney disease with heart failure and with stage 5 chronic kidney disease, or end stage renal disease: Secondary | ICD-10-CM | POA: Diagnosis not present

## 2017-02-07 DIAGNOSIS — D631 Anemia in chronic kidney disease: Secondary | ICD-10-CM | POA: Diagnosis not present

## 2017-02-07 DIAGNOSIS — N179 Acute kidney failure, unspecified: Secondary | ICD-10-CM | POA: Diagnosis not present

## 2017-02-07 DIAGNOSIS — N390 Urinary tract infection, site not specified: Secondary | ICD-10-CM | POA: Diagnosis not present

## 2017-02-07 DIAGNOSIS — G250 Essential tremor: Secondary | ICD-10-CM | POA: Diagnosis not present

## 2017-02-07 DIAGNOSIS — I509 Heart failure, unspecified: Secondary | ICD-10-CM | POA: Diagnosis not present

## 2017-02-07 DIAGNOSIS — Z833 Family history of diabetes mellitus: Secondary | ICD-10-CM | POA: Diagnosis not present

## 2017-02-08 DIAGNOSIS — Z833 Family history of diabetes mellitus: Secondary | ICD-10-CM | POA: Diagnosis not present

## 2017-02-08 DIAGNOSIS — N390 Urinary tract infection, site not specified: Secondary | ICD-10-CM | POA: Diagnosis not present

## 2017-02-08 DIAGNOSIS — D631 Anemia in chronic kidney disease: Secondary | ICD-10-CM | POA: Diagnosis not present

## 2017-02-08 DIAGNOSIS — N179 Acute kidney failure, unspecified: Secondary | ICD-10-CM | POA: Diagnosis not present

## 2017-02-08 DIAGNOSIS — I509 Heart failure, unspecified: Secondary | ICD-10-CM | POA: Diagnosis not present

## 2017-02-08 DIAGNOSIS — G250 Essential tremor: Secondary | ICD-10-CM | POA: Diagnosis not present

## 2017-02-09 DIAGNOSIS — I509 Heart failure, unspecified: Secondary | ICD-10-CM | POA: Diagnosis not present

## 2017-02-09 DIAGNOSIS — G250 Essential tremor: Secondary | ICD-10-CM | POA: Diagnosis not present

## 2017-02-09 DIAGNOSIS — N390 Urinary tract infection, site not specified: Secondary | ICD-10-CM | POA: Diagnosis not present

## 2017-02-09 DIAGNOSIS — N179 Acute kidney failure, unspecified: Secondary | ICD-10-CM | POA: Diagnosis not present

## 2017-02-09 DIAGNOSIS — Z833 Family history of diabetes mellitus: Secondary | ICD-10-CM | POA: Diagnosis not present

## 2017-02-09 DIAGNOSIS — D631 Anemia in chronic kidney disease: Secondary | ICD-10-CM | POA: Diagnosis not present

## 2017-02-10 DIAGNOSIS — I509 Heart failure, unspecified: Secondary | ICD-10-CM | POA: Diagnosis not present

## 2017-02-10 DIAGNOSIS — Z833 Family history of diabetes mellitus: Secondary | ICD-10-CM | POA: Diagnosis not present

## 2017-02-10 DIAGNOSIS — N390 Urinary tract infection, site not specified: Secondary | ICD-10-CM | POA: Diagnosis not present

## 2017-02-10 DIAGNOSIS — G250 Essential tremor: Secondary | ICD-10-CM | POA: Diagnosis not present

## 2017-02-10 DIAGNOSIS — D631 Anemia in chronic kidney disease: Secondary | ICD-10-CM | POA: Diagnosis not present

## 2017-02-10 DIAGNOSIS — N179 Acute kidney failure, unspecified: Secondary | ICD-10-CM | POA: Diagnosis not present

## 2017-02-11 DIAGNOSIS — I132 Hypertensive heart and chronic kidney disease with heart failure and with stage 5 chronic kidney disease, or end stage renal disease: Secondary | ICD-10-CM | POA: Diagnosis not present

## 2017-02-11 DIAGNOSIS — Z483 Aftercare following surgery for neoplasm: Secondary | ICD-10-CM | POA: Diagnosis not present

## 2017-02-11 DIAGNOSIS — E1122 Type 2 diabetes mellitus with diabetic chronic kidney disease: Secondary | ICD-10-CM | POA: Diagnosis not present

## 2017-02-11 DIAGNOSIS — I5032 Chronic diastolic (congestive) heart failure: Secondary | ICD-10-CM | POA: Diagnosis not present

## 2017-02-14 DIAGNOSIS — E1122 Type 2 diabetes mellitus with diabetic chronic kidney disease: Secondary | ICD-10-CM | POA: Diagnosis not present

## 2017-02-14 DIAGNOSIS — N184 Chronic kidney disease, stage 4 (severe): Secondary | ICD-10-CM | POA: Diagnosis not present

## 2017-02-15 DIAGNOSIS — I5032 Chronic diastolic (congestive) heart failure: Secondary | ICD-10-CM | POA: Diagnosis not present

## 2017-02-15 DIAGNOSIS — I132 Hypertensive heart and chronic kidney disease with heart failure and with stage 5 chronic kidney disease, or end stage renal disease: Secondary | ICD-10-CM | POA: Diagnosis not present

## 2017-02-15 DIAGNOSIS — E1122 Type 2 diabetes mellitus with diabetic chronic kidney disease: Secondary | ICD-10-CM | POA: Diagnosis not present

## 2017-02-15 DIAGNOSIS — Z483 Aftercare following surgery for neoplasm: Secondary | ICD-10-CM | POA: Diagnosis not present

## 2017-02-16 DIAGNOSIS — Z483 Aftercare following surgery for neoplasm: Secondary | ICD-10-CM | POA: Diagnosis not present

## 2017-02-16 DIAGNOSIS — I5032 Chronic diastolic (congestive) heart failure: Secondary | ICD-10-CM | POA: Diagnosis not present

## 2017-02-16 DIAGNOSIS — I132 Hypertensive heart and chronic kidney disease with heart failure and with stage 5 chronic kidney disease, or end stage renal disease: Secondary | ICD-10-CM | POA: Diagnosis not present

## 2017-02-16 DIAGNOSIS — E1122 Type 2 diabetes mellitus with diabetic chronic kidney disease: Secondary | ICD-10-CM | POA: Diagnosis not present

## 2017-02-17 DIAGNOSIS — I132 Hypertensive heart and chronic kidney disease with heart failure and with stage 5 chronic kidney disease, or end stage renal disease: Secondary | ICD-10-CM | POA: Diagnosis not present

## 2017-02-17 DIAGNOSIS — I5032 Chronic diastolic (congestive) heart failure: Secondary | ICD-10-CM | POA: Diagnosis not present

## 2017-02-17 DIAGNOSIS — Z483 Aftercare following surgery for neoplasm: Secondary | ICD-10-CM | POA: Diagnosis not present

## 2017-02-17 DIAGNOSIS — E1122 Type 2 diabetes mellitus with diabetic chronic kidney disease: Secondary | ICD-10-CM | POA: Diagnosis not present

## 2017-02-22 DIAGNOSIS — N390 Urinary tract infection, site not specified: Secondary | ICD-10-CM | POA: Diagnosis not present

## 2017-02-22 DIAGNOSIS — I5032 Chronic diastolic (congestive) heart failure: Secondary | ICD-10-CM | POA: Diagnosis not present

## 2017-02-22 DIAGNOSIS — E1122 Type 2 diabetes mellitus with diabetic chronic kidney disease: Secondary | ICD-10-CM | POA: Diagnosis not present

## 2017-02-22 DIAGNOSIS — Z483 Aftercare following surgery for neoplasm: Secondary | ICD-10-CM | POA: Diagnosis not present

## 2017-02-22 DIAGNOSIS — I132 Hypertensive heart and chronic kidney disease with heart failure and with stage 5 chronic kidney disease, or end stage renal disease: Secondary | ICD-10-CM | POA: Diagnosis not present

## 2017-02-23 DIAGNOSIS — I5032 Chronic diastolic (congestive) heart failure: Secondary | ICD-10-CM | POA: Diagnosis not present

## 2017-02-23 DIAGNOSIS — M5136 Other intervertebral disc degeneration, lumbar region: Secondary | ICD-10-CM | POA: Diagnosis not present

## 2017-02-23 DIAGNOSIS — E1122 Type 2 diabetes mellitus with diabetic chronic kidney disease: Secondary | ICD-10-CM | POA: Diagnosis not present

## 2017-02-23 DIAGNOSIS — E785 Hyperlipidemia, unspecified: Secondary | ICD-10-CM | POA: Diagnosis not present

## 2017-02-23 DIAGNOSIS — Z87891 Personal history of nicotine dependence: Secondary | ICD-10-CM | POA: Diagnosis not present

## 2017-02-23 DIAGNOSIS — E1142 Type 2 diabetes mellitus with diabetic polyneuropathy: Secondary | ICD-10-CM | POA: Diagnosis not present

## 2017-02-23 DIAGNOSIS — Z888 Allergy status to other drugs, medicaments and biological substances status: Secondary | ICD-10-CM | POA: Diagnosis not present

## 2017-02-23 DIAGNOSIS — N390 Urinary tract infection, site not specified: Secondary | ICD-10-CM | POA: Diagnosis not present

## 2017-02-23 DIAGNOSIS — I130 Hypertensive heart and chronic kidney disease with heart failure and stage 1 through stage 4 chronic kidney disease, or unspecified chronic kidney disease: Secondary | ICD-10-CM | POA: Diagnosis not present

## 2017-02-23 DIAGNOSIS — Z87440 Personal history of urinary (tract) infections: Secondary | ICD-10-CM | POA: Diagnosis not present

## 2017-02-23 DIAGNOSIS — Z79899 Other long term (current) drug therapy: Secondary | ICD-10-CM | POA: Diagnosis not present

## 2017-02-23 DIAGNOSIS — Z7982 Long term (current) use of aspirin: Secondary | ICD-10-CM | POA: Diagnosis not present

## 2017-02-23 DIAGNOSIS — N3001 Acute cystitis with hematuria: Secondary | ICD-10-CM | POA: Diagnosis not present

## 2017-02-23 DIAGNOSIS — K5900 Constipation, unspecified: Secondary | ICD-10-CM | POA: Diagnosis not present

## 2017-02-23 DIAGNOSIS — N184 Chronic kidney disease, stage 4 (severe): Secondary | ICD-10-CM | POA: Diagnosis not present

## 2017-02-23 DIAGNOSIS — R0902 Hypoxemia: Secondary | ICD-10-CM | POA: Diagnosis not present

## 2017-02-23 DIAGNOSIS — M1A9XX0 Chronic gout, unspecified, without tophus (tophi): Secondary | ICD-10-CM | POA: Diagnosis not present

## 2017-02-23 DIAGNOSIS — B9689 Other specified bacterial agents as the cause of diseases classified elsewhere: Secondary | ICD-10-CM | POA: Diagnosis not present

## 2017-02-23 DIAGNOSIS — D631 Anemia in chronic kidney disease: Secondary | ICD-10-CM | POA: Diagnosis not present

## 2017-02-23 DIAGNOSIS — N179 Acute kidney failure, unspecified: Secondary | ICD-10-CM | POA: Diagnosis not present

## 2017-02-23 DIAGNOSIS — Z7984 Long term (current) use of oral hypoglycemic drugs: Secondary | ICD-10-CM | POA: Diagnosis not present

## 2017-02-23 DIAGNOSIS — N319 Neuromuscular dysfunction of bladder, unspecified: Secondary | ICD-10-CM | POA: Diagnosis not present

## 2017-02-23 DIAGNOSIS — R338 Other retention of urine: Secondary | ICD-10-CM | POA: Diagnosis not present

## 2017-02-23 DIAGNOSIS — J449 Chronic obstructive pulmonary disease, unspecified: Secondary | ICD-10-CM | POA: Diagnosis not present

## 2017-02-23 DIAGNOSIS — R509 Fever, unspecified: Secondary | ICD-10-CM | POA: Diagnosis not present

## 2017-03-02 DIAGNOSIS — I5032 Chronic diastolic (congestive) heart failure: Secondary | ICD-10-CM | POA: Diagnosis not present

## 2017-03-02 DIAGNOSIS — E1122 Type 2 diabetes mellitus with diabetic chronic kidney disease: Secondary | ICD-10-CM | POA: Diagnosis not present

## 2017-03-02 DIAGNOSIS — Z483 Aftercare following surgery for neoplasm: Secondary | ICD-10-CM | POA: Diagnosis not present

## 2017-03-02 DIAGNOSIS — I132 Hypertensive heart and chronic kidney disease with heart failure and with stage 5 chronic kidney disease, or end stage renal disease: Secondary | ICD-10-CM | POA: Diagnosis not present

## 2017-03-03 DIAGNOSIS — Z483 Aftercare following surgery for neoplasm: Secondary | ICD-10-CM | POA: Diagnosis not present

## 2017-03-03 DIAGNOSIS — E1122 Type 2 diabetes mellitus with diabetic chronic kidney disease: Secondary | ICD-10-CM | POA: Diagnosis not present

## 2017-03-03 DIAGNOSIS — I132 Hypertensive heart and chronic kidney disease with heart failure and with stage 5 chronic kidney disease, or end stage renal disease: Secondary | ICD-10-CM | POA: Diagnosis not present

## 2017-03-03 DIAGNOSIS — I5032 Chronic diastolic (congestive) heart failure: Secondary | ICD-10-CM | POA: Diagnosis not present

## 2017-03-04 DIAGNOSIS — I132 Hypertensive heart and chronic kidney disease with heart failure and with stage 5 chronic kidney disease, or end stage renal disease: Secondary | ICD-10-CM | POA: Diagnosis not present

## 2017-03-04 DIAGNOSIS — Z483 Aftercare following surgery for neoplasm: Secondary | ICD-10-CM | POA: Diagnosis not present

## 2017-03-04 DIAGNOSIS — I5032 Chronic diastolic (congestive) heart failure: Secondary | ICD-10-CM | POA: Diagnosis not present

## 2017-03-04 DIAGNOSIS — N390 Urinary tract infection, site not specified: Secondary | ICD-10-CM | POA: Diagnosis not present

## 2017-03-04 DIAGNOSIS — E1122 Type 2 diabetes mellitus with diabetic chronic kidney disease: Secondary | ICD-10-CM | POA: Diagnosis not present

## 2017-03-08 DIAGNOSIS — I5032 Chronic diastolic (congestive) heart failure: Secondary | ICD-10-CM | POA: Diagnosis not present

## 2017-03-08 DIAGNOSIS — E1122 Type 2 diabetes mellitus with diabetic chronic kidney disease: Secondary | ICD-10-CM | POA: Diagnosis not present

## 2017-03-08 DIAGNOSIS — I132 Hypertensive heart and chronic kidney disease with heart failure and with stage 5 chronic kidney disease, or end stage renal disease: Secondary | ICD-10-CM | POA: Diagnosis not present

## 2017-03-08 DIAGNOSIS — Z483 Aftercare following surgery for neoplasm: Secondary | ICD-10-CM | POA: Diagnosis not present

## 2017-03-09 DIAGNOSIS — I5032 Chronic diastolic (congestive) heart failure: Secondary | ICD-10-CM | POA: Diagnosis not present

## 2017-03-09 DIAGNOSIS — I132 Hypertensive heart and chronic kidney disease with heart failure and with stage 5 chronic kidney disease, or end stage renal disease: Secondary | ICD-10-CM | POA: Diagnosis not present

## 2017-03-09 DIAGNOSIS — Z483 Aftercare following surgery for neoplasm: Secondary | ICD-10-CM | POA: Diagnosis not present

## 2017-03-09 DIAGNOSIS — E1122 Type 2 diabetes mellitus with diabetic chronic kidney disease: Secondary | ICD-10-CM | POA: Diagnosis not present

## 2017-03-10 DIAGNOSIS — Z483 Aftercare following surgery for neoplasm: Secondary | ICD-10-CM | POA: Diagnosis not present

## 2017-03-10 DIAGNOSIS — E1122 Type 2 diabetes mellitus with diabetic chronic kidney disease: Secondary | ICD-10-CM | POA: Diagnosis not present

## 2017-03-10 DIAGNOSIS — I132 Hypertensive heart and chronic kidney disease with heart failure and with stage 5 chronic kidney disease, or end stage renal disease: Secondary | ICD-10-CM | POA: Diagnosis not present

## 2017-03-10 DIAGNOSIS — I5032 Chronic diastolic (congestive) heart failure: Secondary | ICD-10-CM | POA: Diagnosis not present

## 2017-03-12 DIAGNOSIS — I700 Atherosclerosis of aorta: Secondary | ICD-10-CM | POA: Diagnosis not present

## 2017-03-12 DIAGNOSIS — R300 Dysuria: Secondary | ICD-10-CM | POA: Diagnosis not present

## 2017-03-12 DIAGNOSIS — E1122 Type 2 diabetes mellitus with diabetic chronic kidney disease: Secondary | ICD-10-CM | POA: Diagnosis not present

## 2017-03-12 DIAGNOSIS — N184 Chronic kidney disease, stage 4 (severe): Secondary | ICD-10-CM | POA: Diagnosis not present

## 2017-03-15 DIAGNOSIS — I5032 Chronic diastolic (congestive) heart failure: Secondary | ICD-10-CM | POA: Diagnosis not present

## 2017-03-15 DIAGNOSIS — I132 Hypertensive heart and chronic kidney disease with heart failure and with stage 5 chronic kidney disease, or end stage renal disease: Secondary | ICD-10-CM | POA: Diagnosis not present

## 2017-03-15 DIAGNOSIS — E1142 Type 2 diabetes mellitus with diabetic polyneuropathy: Secondary | ICD-10-CM | POA: Diagnosis not present

## 2017-03-15 DIAGNOSIS — K219 Gastro-esophageal reflux disease without esophagitis: Secondary | ICD-10-CM | POA: Diagnosis not present

## 2017-03-15 DIAGNOSIS — N185 Chronic kidney disease, stage 5: Secondary | ICD-10-CM | POA: Diagnosis not present

## 2017-03-15 DIAGNOSIS — M1A9XX0 Chronic gout, unspecified, without tophus (tophi): Secondary | ICD-10-CM | POA: Diagnosis not present

## 2017-03-15 DIAGNOSIS — Z483 Aftercare following surgery for neoplasm: Secondary | ICD-10-CM | POA: Diagnosis not present

## 2017-03-15 DIAGNOSIS — M5136 Other intervertebral disc degeneration, lumbar region: Secondary | ICD-10-CM | POA: Diagnosis not present

## 2017-03-15 DIAGNOSIS — E1122 Type 2 diabetes mellitus with diabetic chronic kidney disease: Secondary | ICD-10-CM | POA: Diagnosis not present

## 2017-03-16 DIAGNOSIS — N185 Chronic kidney disease, stage 5: Secondary | ICD-10-CM | POA: Diagnosis not present

## 2017-03-16 DIAGNOSIS — Z483 Aftercare following surgery for neoplasm: Secondary | ICD-10-CM | POA: Diagnosis not present

## 2017-03-16 DIAGNOSIS — M1A9XX0 Chronic gout, unspecified, without tophus (tophi): Secondary | ICD-10-CM | POA: Diagnosis not present

## 2017-03-16 DIAGNOSIS — I132 Hypertensive heart and chronic kidney disease with heart failure and with stage 5 chronic kidney disease, or end stage renal disease: Secondary | ICD-10-CM | POA: Diagnosis not present

## 2017-03-16 DIAGNOSIS — I5032 Chronic diastolic (congestive) heart failure: Secondary | ICD-10-CM | POA: Diagnosis not present

## 2017-03-16 DIAGNOSIS — M5136 Other intervertebral disc degeneration, lumbar region: Secondary | ICD-10-CM | POA: Diagnosis not present

## 2017-03-16 DIAGNOSIS — K219 Gastro-esophageal reflux disease without esophagitis: Secondary | ICD-10-CM | POA: Diagnosis not present

## 2017-03-16 DIAGNOSIS — E1142 Type 2 diabetes mellitus with diabetic polyneuropathy: Secondary | ICD-10-CM | POA: Diagnosis not present

## 2017-03-16 DIAGNOSIS — E1122 Type 2 diabetes mellitus with diabetic chronic kidney disease: Secondary | ICD-10-CM | POA: Diagnosis not present

## 2017-03-18 DIAGNOSIS — E1142 Type 2 diabetes mellitus with diabetic polyneuropathy: Secondary | ICD-10-CM | POA: Diagnosis not present

## 2017-03-18 DIAGNOSIS — M5136 Other intervertebral disc degeneration, lumbar region: Secondary | ICD-10-CM | POA: Diagnosis not present

## 2017-03-18 DIAGNOSIS — Z87891 Personal history of nicotine dependence: Secondary | ICD-10-CM | POA: Diagnosis not present

## 2017-03-18 DIAGNOSIS — T83511D Infection and inflammatory reaction due to indwelling urethral catheter, subsequent encounter: Secondary | ICD-10-CM | POA: Diagnosis not present

## 2017-03-18 DIAGNOSIS — Z483 Aftercare following surgery for neoplasm: Secondary | ICD-10-CM | POA: Diagnosis not present

## 2017-03-18 DIAGNOSIS — N185 Chronic kidney disease, stage 5: Secondary | ICD-10-CM | POA: Diagnosis not present

## 2017-03-18 DIAGNOSIS — A419 Sepsis, unspecified organism: Secondary | ICD-10-CM | POA: Diagnosis not present

## 2017-03-18 DIAGNOSIS — Z7982 Long term (current) use of aspirin: Secondary | ICD-10-CM | POA: Diagnosis not present

## 2017-03-18 DIAGNOSIS — A4189 Other specified sepsis: Secondary | ICD-10-CM | POA: Diagnosis not present

## 2017-03-18 DIAGNOSIS — N179 Acute kidney failure, unspecified: Secondary | ICD-10-CM | POA: Diagnosis not present

## 2017-03-18 DIAGNOSIS — Z87440 Personal history of urinary (tract) infections: Secondary | ICD-10-CM | POA: Diagnosis not present

## 2017-03-18 DIAGNOSIS — Z79899 Other long term (current) drug therapy: Secondary | ICD-10-CM | POA: Diagnosis not present

## 2017-03-18 DIAGNOSIS — D631 Anemia in chronic kidney disease: Secondary | ICD-10-CM | POA: Diagnosis not present

## 2017-03-18 DIAGNOSIS — E785 Hyperlipidemia, unspecified: Secondary | ICD-10-CM | POA: Diagnosis not present

## 2017-03-18 DIAGNOSIS — M1A9XX0 Chronic gout, unspecified, without tophus (tophi): Secondary | ICD-10-CM | POA: Diagnosis not present

## 2017-03-18 DIAGNOSIS — K219 Gastro-esophageal reflux disease without esophagitis: Secondary | ICD-10-CM | POA: Diagnosis not present

## 2017-03-18 DIAGNOSIS — I5032 Chronic diastolic (congestive) heart failure: Secondary | ICD-10-CM | POA: Diagnosis not present

## 2017-03-18 DIAGNOSIS — N390 Urinary tract infection, site not specified: Secondary | ICD-10-CM | POA: Diagnosis not present

## 2017-03-18 DIAGNOSIS — N184 Chronic kidney disease, stage 4 (severe): Secondary | ICD-10-CM | POA: Diagnosis not present

## 2017-03-18 DIAGNOSIS — I129 Hypertensive chronic kidney disease with stage 1 through stage 4 chronic kidney disease, or unspecified chronic kidney disease: Secondary | ICD-10-CM | POA: Diagnosis not present

## 2017-03-18 DIAGNOSIS — Z79891 Long term (current) use of opiate analgesic: Secondary | ICD-10-CM | POA: Diagnosis not present

## 2017-03-18 DIAGNOSIS — N319 Neuromuscular dysfunction of bladder, unspecified: Secondary | ICD-10-CM | POA: Diagnosis not present

## 2017-03-18 DIAGNOSIS — E1122 Type 2 diabetes mellitus with diabetic chronic kidney disease: Secondary | ICD-10-CM | POA: Diagnosis not present

## 2017-03-18 DIAGNOSIS — A4181 Sepsis due to Enterococcus: Secondary | ICD-10-CM | POA: Diagnosis not present

## 2017-03-18 DIAGNOSIS — T83511A Infection and inflammatory reaction due to indwelling urethral catheter, initial encounter: Secondary | ICD-10-CM | POA: Diagnosis not present

## 2017-03-18 DIAGNOSIS — N3001 Acute cystitis with hematuria: Secondary | ICD-10-CM | POA: Diagnosis not present

## 2017-03-18 DIAGNOSIS — I132 Hypertensive heart and chronic kidney disease with heart failure and with stage 5 chronic kidney disease, or end stage renal disease: Secondary | ICD-10-CM | POA: Diagnosis not present

## 2017-03-18 DIAGNOSIS — R338 Other retention of urine: Secondary | ICD-10-CM | POA: Diagnosis not present

## 2017-03-18 DIAGNOSIS — R6521 Severe sepsis with septic shock: Secondary | ICD-10-CM | POA: Diagnosis not present

## 2017-03-22 DIAGNOSIS — E1142 Type 2 diabetes mellitus with diabetic polyneuropathy: Secondary | ICD-10-CM | POA: Diagnosis not present

## 2017-03-22 DIAGNOSIS — M1A9XX0 Chronic gout, unspecified, without tophus (tophi): Secondary | ICD-10-CM | POA: Diagnosis not present

## 2017-03-22 DIAGNOSIS — Z483 Aftercare following surgery for neoplasm: Secondary | ICD-10-CM | POA: Diagnosis not present

## 2017-03-22 DIAGNOSIS — M5136 Other intervertebral disc degeneration, lumbar region: Secondary | ICD-10-CM | POA: Diagnosis not present

## 2017-03-22 DIAGNOSIS — I5032 Chronic diastolic (congestive) heart failure: Secondary | ICD-10-CM | POA: Diagnosis not present

## 2017-03-22 DIAGNOSIS — E1122 Type 2 diabetes mellitus with diabetic chronic kidney disease: Secondary | ICD-10-CM | POA: Diagnosis not present

## 2017-03-22 DIAGNOSIS — N185 Chronic kidney disease, stage 5: Secondary | ICD-10-CM | POA: Diagnosis not present

## 2017-03-22 DIAGNOSIS — K219 Gastro-esophageal reflux disease without esophagitis: Secondary | ICD-10-CM | POA: Diagnosis not present

## 2017-03-22 DIAGNOSIS — I132 Hypertensive heart and chronic kidney disease with heart failure and with stage 5 chronic kidney disease, or end stage renal disease: Secondary | ICD-10-CM | POA: Diagnosis not present

## 2017-03-23 ENCOUNTER — Ambulatory Visit: Payer: Medicare Other | Admitting: Urology

## 2017-03-23 DIAGNOSIS — R339 Retention of urine, unspecified: Secondary | ICD-10-CM | POA: Diagnosis not present

## 2017-03-23 DIAGNOSIS — N3021 Other chronic cystitis with hematuria: Secondary | ICD-10-CM

## 2017-03-25 DIAGNOSIS — Z483 Aftercare following surgery for neoplasm: Secondary | ICD-10-CM | POA: Diagnosis not present

## 2017-03-25 DIAGNOSIS — E1142 Type 2 diabetes mellitus with diabetic polyneuropathy: Secondary | ICD-10-CM | POA: Diagnosis not present

## 2017-03-25 DIAGNOSIS — I132 Hypertensive heart and chronic kidney disease with heart failure and with stage 5 chronic kidney disease, or end stage renal disease: Secondary | ICD-10-CM | POA: Diagnosis not present

## 2017-03-25 DIAGNOSIS — E1122 Type 2 diabetes mellitus with diabetic chronic kidney disease: Secondary | ICD-10-CM | POA: Diagnosis not present

## 2017-03-25 DIAGNOSIS — M1A9XX0 Chronic gout, unspecified, without tophus (tophi): Secondary | ICD-10-CM | POA: Diagnosis not present

## 2017-03-25 DIAGNOSIS — K219 Gastro-esophageal reflux disease without esophagitis: Secondary | ICD-10-CM | POA: Diagnosis not present

## 2017-03-25 DIAGNOSIS — M5136 Other intervertebral disc degeneration, lumbar region: Secondary | ICD-10-CM | POA: Diagnosis not present

## 2017-03-25 DIAGNOSIS — N185 Chronic kidney disease, stage 5: Secondary | ICD-10-CM | POA: Diagnosis not present

## 2017-03-25 DIAGNOSIS — I5032 Chronic diastolic (congestive) heart failure: Secondary | ICD-10-CM | POA: Diagnosis not present

## 2017-03-28 DIAGNOSIS — M5136 Other intervertebral disc degeneration, lumbar region: Secondary | ICD-10-CM | POA: Diagnosis not present

## 2017-03-28 DIAGNOSIS — I5032 Chronic diastolic (congestive) heart failure: Secondary | ICD-10-CM | POA: Diagnosis not present

## 2017-03-28 DIAGNOSIS — M1A9XX0 Chronic gout, unspecified, without tophus (tophi): Secondary | ICD-10-CM | POA: Diagnosis not present

## 2017-03-28 DIAGNOSIS — E1142 Type 2 diabetes mellitus with diabetic polyneuropathy: Secondary | ICD-10-CM | POA: Diagnosis not present

## 2017-03-28 DIAGNOSIS — K219 Gastro-esophageal reflux disease without esophagitis: Secondary | ICD-10-CM | POA: Diagnosis not present

## 2017-03-28 DIAGNOSIS — Z483 Aftercare following surgery for neoplasm: Secondary | ICD-10-CM | POA: Diagnosis not present

## 2017-03-28 DIAGNOSIS — E1122 Type 2 diabetes mellitus with diabetic chronic kidney disease: Secondary | ICD-10-CM | POA: Diagnosis not present

## 2017-03-28 DIAGNOSIS — N184 Chronic kidney disease, stage 4 (severe): Secondary | ICD-10-CM | POA: Diagnosis not present

## 2017-03-28 DIAGNOSIS — I132 Hypertensive heart and chronic kidney disease with heart failure and with stage 5 chronic kidney disease, or end stage renal disease: Secondary | ICD-10-CM | POA: Diagnosis not present

## 2017-03-28 DIAGNOSIS — N185 Chronic kidney disease, stage 5: Secondary | ICD-10-CM | POA: Diagnosis not present

## 2017-03-28 DIAGNOSIS — N3001 Acute cystitis with hematuria: Secondary | ICD-10-CM | POA: Diagnosis not present

## 2017-03-30 DIAGNOSIS — E1122 Type 2 diabetes mellitus with diabetic chronic kidney disease: Secondary | ICD-10-CM | POA: Diagnosis not present

## 2017-03-30 DIAGNOSIS — K219 Gastro-esophageal reflux disease without esophagitis: Secondary | ICD-10-CM | POA: Diagnosis not present

## 2017-03-30 DIAGNOSIS — M1A9XX0 Chronic gout, unspecified, without tophus (tophi): Secondary | ICD-10-CM | POA: Diagnosis not present

## 2017-03-30 DIAGNOSIS — R339 Retention of urine, unspecified: Secondary | ICD-10-CM | POA: Diagnosis not present

## 2017-03-30 DIAGNOSIS — N185 Chronic kidney disease, stage 5: Secondary | ICD-10-CM | POA: Diagnosis not present

## 2017-03-30 DIAGNOSIS — Z483 Aftercare following surgery for neoplasm: Secondary | ICD-10-CM | POA: Diagnosis not present

## 2017-03-30 DIAGNOSIS — I132 Hypertensive heart and chronic kidney disease with heart failure and with stage 5 chronic kidney disease, or end stage renal disease: Secondary | ICD-10-CM | POA: Diagnosis not present

## 2017-03-30 DIAGNOSIS — M5136 Other intervertebral disc degeneration, lumbar region: Secondary | ICD-10-CM | POA: Diagnosis not present

## 2017-03-30 DIAGNOSIS — I5032 Chronic diastolic (congestive) heart failure: Secondary | ICD-10-CM | POA: Diagnosis not present

## 2017-03-30 DIAGNOSIS — N3000 Acute cystitis without hematuria: Secondary | ICD-10-CM | POA: Diagnosis not present

## 2017-03-30 DIAGNOSIS — E1142 Type 2 diabetes mellitus with diabetic polyneuropathy: Secondary | ICD-10-CM | POA: Diagnosis not present

## 2017-04-04 DIAGNOSIS — M5136 Other intervertebral disc degeneration, lumbar region: Secondary | ICD-10-CM | POA: Diagnosis not present

## 2017-04-04 DIAGNOSIS — N185 Chronic kidney disease, stage 5: Secondary | ICD-10-CM | POA: Diagnosis not present

## 2017-04-04 DIAGNOSIS — M1A9XX0 Chronic gout, unspecified, without tophus (tophi): Secondary | ICD-10-CM | POA: Diagnosis not present

## 2017-04-04 DIAGNOSIS — E1142 Type 2 diabetes mellitus with diabetic polyneuropathy: Secondary | ICD-10-CM | POA: Diagnosis not present

## 2017-04-04 DIAGNOSIS — I132 Hypertensive heart and chronic kidney disease with heart failure and with stage 5 chronic kidney disease, or end stage renal disease: Secondary | ICD-10-CM | POA: Diagnosis not present

## 2017-04-04 DIAGNOSIS — K219 Gastro-esophageal reflux disease without esophagitis: Secondary | ICD-10-CM | POA: Diagnosis not present

## 2017-04-04 DIAGNOSIS — E1122 Type 2 diabetes mellitus with diabetic chronic kidney disease: Secondary | ICD-10-CM | POA: Diagnosis not present

## 2017-04-04 DIAGNOSIS — Z483 Aftercare following surgery for neoplasm: Secondary | ICD-10-CM | POA: Diagnosis not present

## 2017-04-04 DIAGNOSIS — I5032 Chronic diastolic (congestive) heart failure: Secondary | ICD-10-CM | POA: Diagnosis not present

## 2017-04-05 DIAGNOSIS — E1142 Type 2 diabetes mellitus with diabetic polyneuropathy: Secondary | ICD-10-CM | POA: Diagnosis not present

## 2017-04-05 DIAGNOSIS — M5136 Other intervertebral disc degeneration, lumbar region: Secondary | ICD-10-CM | POA: Diagnosis not present

## 2017-04-05 DIAGNOSIS — E1122 Type 2 diabetes mellitus with diabetic chronic kidney disease: Secondary | ICD-10-CM | POA: Diagnosis not present

## 2017-04-05 DIAGNOSIS — N185 Chronic kidney disease, stage 5: Secondary | ICD-10-CM | POA: Diagnosis not present

## 2017-04-05 DIAGNOSIS — M1A9XX0 Chronic gout, unspecified, without tophus (tophi): Secondary | ICD-10-CM | POA: Diagnosis not present

## 2017-04-05 DIAGNOSIS — I5032 Chronic diastolic (congestive) heart failure: Secondary | ICD-10-CM | POA: Diagnosis not present

## 2017-04-05 DIAGNOSIS — I132 Hypertensive heart and chronic kidney disease with heart failure and with stage 5 chronic kidney disease, or end stage renal disease: Secondary | ICD-10-CM | POA: Diagnosis not present

## 2017-04-05 DIAGNOSIS — Z483 Aftercare following surgery for neoplasm: Secondary | ICD-10-CM | POA: Diagnosis not present

## 2017-04-05 DIAGNOSIS — K219 Gastro-esophageal reflux disease without esophagitis: Secondary | ICD-10-CM | POA: Diagnosis not present

## 2017-04-06 DIAGNOSIS — M1A9XX0 Chronic gout, unspecified, without tophus (tophi): Secondary | ICD-10-CM | POA: Diagnosis not present

## 2017-04-06 DIAGNOSIS — I132 Hypertensive heart and chronic kidney disease with heart failure and with stage 5 chronic kidney disease, or end stage renal disease: Secondary | ICD-10-CM | POA: Diagnosis not present

## 2017-04-06 DIAGNOSIS — M5136 Other intervertebral disc degeneration, lumbar region: Secondary | ICD-10-CM | POA: Diagnosis not present

## 2017-04-06 DIAGNOSIS — E1122 Type 2 diabetes mellitus with diabetic chronic kidney disease: Secondary | ICD-10-CM | POA: Diagnosis not present

## 2017-04-06 DIAGNOSIS — N185 Chronic kidney disease, stage 5: Secondary | ICD-10-CM | POA: Diagnosis not present

## 2017-04-06 DIAGNOSIS — Z483 Aftercare following surgery for neoplasm: Secondary | ICD-10-CM | POA: Diagnosis not present

## 2017-04-06 DIAGNOSIS — E1142 Type 2 diabetes mellitus with diabetic polyneuropathy: Secondary | ICD-10-CM | POA: Diagnosis not present

## 2017-04-06 DIAGNOSIS — I5032 Chronic diastolic (congestive) heart failure: Secondary | ICD-10-CM | POA: Diagnosis not present

## 2017-04-06 DIAGNOSIS — K219 Gastro-esophageal reflux disease without esophagitis: Secondary | ICD-10-CM | POA: Diagnosis not present

## 2017-04-07 DIAGNOSIS — I132 Hypertensive heart and chronic kidney disease with heart failure and with stage 5 chronic kidney disease, or end stage renal disease: Secondary | ICD-10-CM | POA: Diagnosis not present

## 2017-04-07 DIAGNOSIS — M5136 Other intervertebral disc degeneration, lumbar region: Secondary | ICD-10-CM | POA: Diagnosis not present

## 2017-04-07 DIAGNOSIS — E1142 Type 2 diabetes mellitus with diabetic polyneuropathy: Secondary | ICD-10-CM | POA: Diagnosis not present

## 2017-04-07 DIAGNOSIS — K219 Gastro-esophageal reflux disease without esophagitis: Secondary | ICD-10-CM | POA: Diagnosis not present

## 2017-04-07 DIAGNOSIS — N185 Chronic kidney disease, stage 5: Secondary | ICD-10-CM | POA: Diagnosis not present

## 2017-04-07 DIAGNOSIS — E1122 Type 2 diabetes mellitus with diabetic chronic kidney disease: Secondary | ICD-10-CM | POA: Diagnosis not present

## 2017-04-07 DIAGNOSIS — M1A9XX0 Chronic gout, unspecified, without tophus (tophi): Secondary | ICD-10-CM | POA: Diagnosis not present

## 2017-04-07 DIAGNOSIS — Z483 Aftercare following surgery for neoplasm: Secondary | ICD-10-CM | POA: Diagnosis not present

## 2017-04-07 DIAGNOSIS — I5032 Chronic diastolic (congestive) heart failure: Secondary | ICD-10-CM | POA: Diagnosis not present

## 2017-04-12 DIAGNOSIS — M1A9XX0 Chronic gout, unspecified, without tophus (tophi): Secondary | ICD-10-CM | POA: Diagnosis not present

## 2017-04-12 DIAGNOSIS — K219 Gastro-esophageal reflux disease without esophagitis: Secondary | ICD-10-CM | POA: Diagnosis not present

## 2017-04-12 DIAGNOSIS — Z483 Aftercare following surgery for neoplasm: Secondary | ICD-10-CM | POA: Diagnosis not present

## 2017-04-12 DIAGNOSIS — M5136 Other intervertebral disc degeneration, lumbar region: Secondary | ICD-10-CM | POA: Diagnosis not present

## 2017-04-12 DIAGNOSIS — E1122 Type 2 diabetes mellitus with diabetic chronic kidney disease: Secondary | ICD-10-CM | POA: Diagnosis not present

## 2017-04-12 DIAGNOSIS — E1142 Type 2 diabetes mellitus with diabetic polyneuropathy: Secondary | ICD-10-CM | POA: Diagnosis not present

## 2017-04-12 DIAGNOSIS — I5032 Chronic diastolic (congestive) heart failure: Secondary | ICD-10-CM | POA: Diagnosis not present

## 2017-04-12 DIAGNOSIS — N185 Chronic kidney disease, stage 5: Secondary | ICD-10-CM | POA: Diagnosis not present

## 2017-04-12 DIAGNOSIS — I132 Hypertensive heart and chronic kidney disease with heart failure and with stage 5 chronic kidney disease, or end stage renal disease: Secondary | ICD-10-CM | POA: Diagnosis not present

## 2017-04-13 DIAGNOSIS — K219 Gastro-esophageal reflux disease without esophagitis: Secondary | ICD-10-CM | POA: Diagnosis not present

## 2017-04-13 DIAGNOSIS — E1142 Type 2 diabetes mellitus with diabetic polyneuropathy: Secondary | ICD-10-CM | POA: Diagnosis not present

## 2017-04-13 DIAGNOSIS — I132 Hypertensive heart and chronic kidney disease with heart failure and with stage 5 chronic kidney disease, or end stage renal disease: Secondary | ICD-10-CM | POA: Diagnosis not present

## 2017-04-13 DIAGNOSIS — M1A9XX0 Chronic gout, unspecified, without tophus (tophi): Secondary | ICD-10-CM | POA: Diagnosis not present

## 2017-04-13 DIAGNOSIS — N185 Chronic kidney disease, stage 5: Secondary | ICD-10-CM | POA: Diagnosis not present

## 2017-04-13 DIAGNOSIS — I5032 Chronic diastolic (congestive) heart failure: Secondary | ICD-10-CM | POA: Diagnosis not present

## 2017-04-13 DIAGNOSIS — M5136 Other intervertebral disc degeneration, lumbar region: Secondary | ICD-10-CM | POA: Diagnosis not present

## 2017-04-13 DIAGNOSIS — E1122 Type 2 diabetes mellitus with diabetic chronic kidney disease: Secondary | ICD-10-CM | POA: Diagnosis not present

## 2017-04-13 DIAGNOSIS — Z483 Aftercare following surgery for neoplasm: Secondary | ICD-10-CM | POA: Diagnosis not present

## 2017-04-14 DIAGNOSIS — M1A9XX0 Chronic gout, unspecified, without tophus (tophi): Secondary | ICD-10-CM | POA: Diagnosis not present

## 2017-04-14 DIAGNOSIS — E1122 Type 2 diabetes mellitus with diabetic chronic kidney disease: Secondary | ICD-10-CM | POA: Diagnosis not present

## 2017-04-14 DIAGNOSIS — N185 Chronic kidney disease, stage 5: Secondary | ICD-10-CM | POA: Diagnosis not present

## 2017-04-14 DIAGNOSIS — K219 Gastro-esophageal reflux disease without esophagitis: Secondary | ICD-10-CM | POA: Diagnosis not present

## 2017-04-14 DIAGNOSIS — Z483 Aftercare following surgery for neoplasm: Secondary | ICD-10-CM | POA: Diagnosis not present

## 2017-04-14 DIAGNOSIS — E1142 Type 2 diabetes mellitus with diabetic polyneuropathy: Secondary | ICD-10-CM | POA: Diagnosis not present

## 2017-04-14 DIAGNOSIS — M5136 Other intervertebral disc degeneration, lumbar region: Secondary | ICD-10-CM | POA: Diagnosis not present

## 2017-04-14 DIAGNOSIS — I132 Hypertensive heart and chronic kidney disease with heart failure and with stage 5 chronic kidney disease, or end stage renal disease: Secondary | ICD-10-CM | POA: Diagnosis not present

## 2017-04-14 DIAGNOSIS — I5032 Chronic diastolic (congestive) heart failure: Secondary | ICD-10-CM | POA: Diagnosis not present

## 2017-04-15 DIAGNOSIS — I132 Hypertensive heart and chronic kidney disease with heart failure and with stage 5 chronic kidney disease, or end stage renal disease: Secondary | ICD-10-CM | POA: Diagnosis not present

## 2017-04-15 DIAGNOSIS — K219 Gastro-esophageal reflux disease without esophagitis: Secondary | ICD-10-CM | POA: Diagnosis not present

## 2017-04-15 DIAGNOSIS — N185 Chronic kidney disease, stage 5: Secondary | ICD-10-CM | POA: Diagnosis not present

## 2017-04-15 DIAGNOSIS — M1A9XX0 Chronic gout, unspecified, without tophus (tophi): Secondary | ICD-10-CM | POA: Diagnosis not present

## 2017-04-15 DIAGNOSIS — I5032 Chronic diastolic (congestive) heart failure: Secondary | ICD-10-CM | POA: Diagnosis not present

## 2017-04-15 DIAGNOSIS — Z483 Aftercare following surgery for neoplasm: Secondary | ICD-10-CM | POA: Diagnosis not present

## 2017-04-15 DIAGNOSIS — E1142 Type 2 diabetes mellitus with diabetic polyneuropathy: Secondary | ICD-10-CM | POA: Diagnosis not present

## 2017-04-15 DIAGNOSIS — E1122 Type 2 diabetes mellitus with diabetic chronic kidney disease: Secondary | ICD-10-CM | POA: Diagnosis not present

## 2017-04-15 DIAGNOSIS — M5136 Other intervertebral disc degeneration, lumbar region: Secondary | ICD-10-CM | POA: Diagnosis not present

## 2017-04-16 DIAGNOSIS — I5032 Chronic diastolic (congestive) heart failure: Secondary | ICD-10-CM | POA: Diagnosis not present

## 2017-04-16 DIAGNOSIS — E1142 Type 2 diabetes mellitus with diabetic polyneuropathy: Secondary | ICD-10-CM | POA: Diagnosis not present

## 2017-04-16 DIAGNOSIS — K219 Gastro-esophageal reflux disease without esophagitis: Secondary | ICD-10-CM | POA: Diagnosis not present

## 2017-04-16 DIAGNOSIS — I132 Hypertensive heart and chronic kidney disease with heart failure and with stage 5 chronic kidney disease, or end stage renal disease: Secondary | ICD-10-CM | POA: Diagnosis not present

## 2017-04-16 DIAGNOSIS — Z483 Aftercare following surgery for neoplasm: Secondary | ICD-10-CM | POA: Diagnosis not present

## 2017-04-16 DIAGNOSIS — N185 Chronic kidney disease, stage 5: Secondary | ICD-10-CM | POA: Diagnosis not present

## 2017-04-16 DIAGNOSIS — M5136 Other intervertebral disc degeneration, lumbar region: Secondary | ICD-10-CM | POA: Diagnosis not present

## 2017-04-16 DIAGNOSIS — E1122 Type 2 diabetes mellitus with diabetic chronic kidney disease: Secondary | ICD-10-CM | POA: Diagnosis not present

## 2017-04-16 DIAGNOSIS — M1A9XX0 Chronic gout, unspecified, without tophus (tophi): Secondary | ICD-10-CM | POA: Diagnosis not present

## 2017-04-20 DIAGNOSIS — I132 Hypertensive heart and chronic kidney disease with heart failure and with stage 5 chronic kidney disease, or end stage renal disease: Secondary | ICD-10-CM | POA: Diagnosis not present

## 2017-04-20 DIAGNOSIS — M5136 Other intervertebral disc degeneration, lumbar region: Secondary | ICD-10-CM | POA: Diagnosis not present

## 2017-04-20 DIAGNOSIS — N185 Chronic kidney disease, stage 5: Secondary | ICD-10-CM | POA: Diagnosis not present

## 2017-04-20 DIAGNOSIS — Z483 Aftercare following surgery for neoplasm: Secondary | ICD-10-CM | POA: Diagnosis not present

## 2017-04-20 DIAGNOSIS — E1142 Type 2 diabetes mellitus with diabetic polyneuropathy: Secondary | ICD-10-CM | POA: Diagnosis not present

## 2017-04-20 DIAGNOSIS — K219 Gastro-esophageal reflux disease without esophagitis: Secondary | ICD-10-CM | POA: Diagnosis not present

## 2017-04-20 DIAGNOSIS — E1122 Type 2 diabetes mellitus with diabetic chronic kidney disease: Secondary | ICD-10-CM | POA: Diagnosis not present

## 2017-04-20 DIAGNOSIS — N184 Chronic kidney disease, stage 4 (severe): Secondary | ICD-10-CM | POA: Diagnosis not present

## 2017-04-20 DIAGNOSIS — N3001 Acute cystitis with hematuria: Secondary | ICD-10-CM | POA: Diagnosis not present

## 2017-04-20 DIAGNOSIS — M1A9XX0 Chronic gout, unspecified, without tophus (tophi): Secondary | ICD-10-CM | POA: Diagnosis not present

## 2017-04-20 DIAGNOSIS — I5032 Chronic diastolic (congestive) heart failure: Secondary | ICD-10-CM | POA: Diagnosis not present

## 2017-04-21 DIAGNOSIS — M5136 Other intervertebral disc degeneration, lumbar region: Secondary | ICD-10-CM | POA: Diagnosis not present

## 2017-04-21 DIAGNOSIS — E1142 Type 2 diabetes mellitus with diabetic polyneuropathy: Secondary | ICD-10-CM | POA: Diagnosis not present

## 2017-04-21 DIAGNOSIS — K219 Gastro-esophageal reflux disease without esophagitis: Secondary | ICD-10-CM | POA: Diagnosis not present

## 2017-04-21 DIAGNOSIS — M1A9XX0 Chronic gout, unspecified, without tophus (tophi): Secondary | ICD-10-CM | POA: Diagnosis not present

## 2017-04-21 DIAGNOSIS — Z483 Aftercare following surgery for neoplasm: Secondary | ICD-10-CM | POA: Diagnosis not present

## 2017-04-21 DIAGNOSIS — N185 Chronic kidney disease, stage 5: Secondary | ICD-10-CM | POA: Diagnosis not present

## 2017-04-21 DIAGNOSIS — I5032 Chronic diastolic (congestive) heart failure: Secondary | ICD-10-CM | POA: Diagnosis not present

## 2017-04-21 DIAGNOSIS — E1122 Type 2 diabetes mellitus with diabetic chronic kidney disease: Secondary | ICD-10-CM | POA: Diagnosis not present

## 2017-04-21 DIAGNOSIS — I132 Hypertensive heart and chronic kidney disease with heart failure and with stage 5 chronic kidney disease, or end stage renal disease: Secondary | ICD-10-CM | POA: Diagnosis not present

## 2017-04-22 DIAGNOSIS — Z483 Aftercare following surgery for neoplasm: Secondary | ICD-10-CM | POA: Diagnosis not present

## 2017-04-22 DIAGNOSIS — I5032 Chronic diastolic (congestive) heart failure: Secondary | ICD-10-CM | POA: Diagnosis not present

## 2017-04-22 DIAGNOSIS — I132 Hypertensive heart and chronic kidney disease with heart failure and with stage 5 chronic kidney disease, or end stage renal disease: Secondary | ICD-10-CM | POA: Diagnosis not present

## 2017-04-22 DIAGNOSIS — E1142 Type 2 diabetes mellitus with diabetic polyneuropathy: Secondary | ICD-10-CM | POA: Diagnosis not present

## 2017-04-22 DIAGNOSIS — E1122 Type 2 diabetes mellitus with diabetic chronic kidney disease: Secondary | ICD-10-CM | POA: Diagnosis not present

## 2017-04-22 DIAGNOSIS — K219 Gastro-esophageal reflux disease without esophagitis: Secondary | ICD-10-CM | POA: Diagnosis not present

## 2017-04-22 DIAGNOSIS — M5136 Other intervertebral disc degeneration, lumbar region: Secondary | ICD-10-CM | POA: Diagnosis not present

## 2017-04-22 DIAGNOSIS — N185 Chronic kidney disease, stage 5: Secondary | ICD-10-CM | POA: Diagnosis not present

## 2017-04-22 DIAGNOSIS — M1A9XX0 Chronic gout, unspecified, without tophus (tophi): Secondary | ICD-10-CM | POA: Diagnosis not present

## 2017-04-23 DIAGNOSIS — E1142 Type 2 diabetes mellitus with diabetic polyneuropathy: Secondary | ICD-10-CM | POA: Diagnosis not present

## 2017-04-23 DIAGNOSIS — E1122 Type 2 diabetes mellitus with diabetic chronic kidney disease: Secondary | ICD-10-CM | POA: Diagnosis not present

## 2017-04-23 DIAGNOSIS — N185 Chronic kidney disease, stage 5: Secondary | ICD-10-CM | POA: Diagnosis not present

## 2017-04-23 DIAGNOSIS — I5032 Chronic diastolic (congestive) heart failure: Secondary | ICD-10-CM | POA: Diagnosis not present

## 2017-04-23 DIAGNOSIS — M1A9XX0 Chronic gout, unspecified, without tophus (tophi): Secondary | ICD-10-CM | POA: Diagnosis not present

## 2017-04-23 DIAGNOSIS — Z483 Aftercare following surgery for neoplasm: Secondary | ICD-10-CM | POA: Diagnosis not present

## 2017-04-23 DIAGNOSIS — I132 Hypertensive heart and chronic kidney disease with heart failure and with stage 5 chronic kidney disease, or end stage renal disease: Secondary | ICD-10-CM | POA: Diagnosis not present

## 2017-04-23 DIAGNOSIS — M5136 Other intervertebral disc degeneration, lumbar region: Secondary | ICD-10-CM | POA: Diagnosis not present

## 2017-04-23 DIAGNOSIS — K219 Gastro-esophageal reflux disease without esophagitis: Secondary | ICD-10-CM | POA: Diagnosis not present

## 2017-04-25 DIAGNOSIS — Z483 Aftercare following surgery for neoplasm: Secondary | ICD-10-CM | POA: Diagnosis not present

## 2017-04-25 DIAGNOSIS — M1A9XX0 Chronic gout, unspecified, without tophus (tophi): Secondary | ICD-10-CM | POA: Diagnosis not present

## 2017-04-25 DIAGNOSIS — I132 Hypertensive heart and chronic kidney disease with heart failure and with stage 5 chronic kidney disease, or end stage renal disease: Secondary | ICD-10-CM | POA: Diagnosis not present

## 2017-04-25 DIAGNOSIS — K219 Gastro-esophageal reflux disease without esophagitis: Secondary | ICD-10-CM | POA: Diagnosis not present

## 2017-04-25 DIAGNOSIS — N185 Chronic kidney disease, stage 5: Secondary | ICD-10-CM | POA: Diagnosis not present

## 2017-04-25 DIAGNOSIS — E1142 Type 2 diabetes mellitus with diabetic polyneuropathy: Secondary | ICD-10-CM | POA: Diagnosis not present

## 2017-04-25 DIAGNOSIS — M5136 Other intervertebral disc degeneration, lumbar region: Secondary | ICD-10-CM | POA: Diagnosis not present

## 2017-04-25 DIAGNOSIS — E1122 Type 2 diabetes mellitus with diabetic chronic kidney disease: Secondary | ICD-10-CM | POA: Diagnosis not present

## 2017-04-25 DIAGNOSIS — I5032 Chronic diastolic (congestive) heart failure: Secondary | ICD-10-CM | POA: Diagnosis not present

## 2017-04-26 DIAGNOSIS — E1142 Type 2 diabetes mellitus with diabetic polyneuropathy: Secondary | ICD-10-CM | POA: Diagnosis not present

## 2017-04-26 DIAGNOSIS — M5136 Other intervertebral disc degeneration, lumbar region: Secondary | ICD-10-CM | POA: Diagnosis not present

## 2017-04-26 DIAGNOSIS — I132 Hypertensive heart and chronic kidney disease with heart failure and with stage 5 chronic kidney disease, or end stage renal disease: Secondary | ICD-10-CM | POA: Diagnosis not present

## 2017-04-26 DIAGNOSIS — M1A9XX0 Chronic gout, unspecified, without tophus (tophi): Secondary | ICD-10-CM | POA: Diagnosis not present

## 2017-04-26 DIAGNOSIS — K219 Gastro-esophageal reflux disease without esophagitis: Secondary | ICD-10-CM | POA: Diagnosis not present

## 2017-04-26 DIAGNOSIS — I5032 Chronic diastolic (congestive) heart failure: Secondary | ICD-10-CM | POA: Diagnosis not present

## 2017-04-26 DIAGNOSIS — Z483 Aftercare following surgery for neoplasm: Secondary | ICD-10-CM | POA: Diagnosis not present

## 2017-04-26 DIAGNOSIS — E1122 Type 2 diabetes mellitus with diabetic chronic kidney disease: Secondary | ICD-10-CM | POA: Diagnosis not present

## 2017-04-26 DIAGNOSIS — N185 Chronic kidney disease, stage 5: Secondary | ICD-10-CM | POA: Diagnosis not present

## 2017-04-27 ENCOUNTER — Ambulatory Visit: Payer: Medicare Other | Admitting: Urology

## 2017-04-27 DIAGNOSIS — M5136 Other intervertebral disc degeneration, lumbar region: Secondary | ICD-10-CM | POA: Diagnosis not present

## 2017-04-27 DIAGNOSIS — K219 Gastro-esophageal reflux disease without esophagitis: Secondary | ICD-10-CM | POA: Diagnosis not present

## 2017-04-27 DIAGNOSIS — E1142 Type 2 diabetes mellitus with diabetic polyneuropathy: Secondary | ICD-10-CM | POA: Diagnosis not present

## 2017-04-27 DIAGNOSIS — E1122 Type 2 diabetes mellitus with diabetic chronic kidney disease: Secondary | ICD-10-CM | POA: Diagnosis not present

## 2017-04-27 DIAGNOSIS — I132 Hypertensive heart and chronic kidney disease with heart failure and with stage 5 chronic kidney disease, or end stage renal disease: Secondary | ICD-10-CM | POA: Diagnosis not present

## 2017-04-27 DIAGNOSIS — Z483 Aftercare following surgery for neoplasm: Secondary | ICD-10-CM | POA: Diagnosis not present

## 2017-04-27 DIAGNOSIS — N185 Chronic kidney disease, stage 5: Secondary | ICD-10-CM | POA: Diagnosis not present

## 2017-04-27 DIAGNOSIS — I5032 Chronic diastolic (congestive) heart failure: Secondary | ICD-10-CM | POA: Diagnosis not present

## 2017-04-27 DIAGNOSIS — M1A9XX0 Chronic gout, unspecified, without tophus (tophi): Secondary | ICD-10-CM | POA: Diagnosis not present

## 2017-04-28 DIAGNOSIS — E039 Hypothyroidism, unspecified: Secondary | ICD-10-CM | POA: Diagnosis not present

## 2017-04-28 DIAGNOSIS — E1142 Type 2 diabetes mellitus with diabetic polyneuropathy: Secondary | ICD-10-CM | POA: Diagnosis not present

## 2017-04-28 DIAGNOSIS — E782 Mixed hyperlipidemia: Secondary | ICD-10-CM | POA: Diagnosis not present

## 2017-04-28 DIAGNOSIS — I132 Hypertensive heart and chronic kidney disease with heart failure and with stage 5 chronic kidney disease, or end stage renal disease: Secondary | ICD-10-CM | POA: Diagnosis not present

## 2017-04-28 DIAGNOSIS — Z483 Aftercare following surgery for neoplasm: Secondary | ICD-10-CM | POA: Diagnosis not present

## 2017-04-28 DIAGNOSIS — K219 Gastro-esophageal reflux disease without esophagitis: Secondary | ICD-10-CM | POA: Diagnosis not present

## 2017-04-28 DIAGNOSIS — M5136 Other intervertebral disc degeneration, lumbar region: Secondary | ICD-10-CM | POA: Diagnosis not present

## 2017-04-28 DIAGNOSIS — I5032 Chronic diastolic (congestive) heart failure: Secondary | ICD-10-CM | POA: Diagnosis not present

## 2017-04-28 DIAGNOSIS — M1A9XX0 Chronic gout, unspecified, without tophus (tophi): Secondary | ICD-10-CM | POA: Diagnosis not present

## 2017-04-28 DIAGNOSIS — E1165 Type 2 diabetes mellitus with hyperglycemia: Secondary | ICD-10-CM | POA: Diagnosis not present

## 2017-04-28 DIAGNOSIS — E1122 Type 2 diabetes mellitus with diabetic chronic kidney disease: Secondary | ICD-10-CM | POA: Diagnosis not present

## 2017-04-28 DIAGNOSIS — N185 Chronic kidney disease, stage 5: Secondary | ICD-10-CM | POA: Diagnosis not present

## 2017-04-29 DIAGNOSIS — Z483 Aftercare following surgery for neoplasm: Secondary | ICD-10-CM | POA: Diagnosis not present

## 2017-04-29 DIAGNOSIS — N185 Chronic kidney disease, stage 5: Secondary | ICD-10-CM | POA: Diagnosis not present

## 2017-04-29 DIAGNOSIS — M1A9XX0 Chronic gout, unspecified, without tophus (tophi): Secondary | ICD-10-CM | POA: Diagnosis not present

## 2017-04-29 DIAGNOSIS — I5032 Chronic diastolic (congestive) heart failure: Secondary | ICD-10-CM | POA: Diagnosis not present

## 2017-04-29 DIAGNOSIS — E1142 Type 2 diabetes mellitus with diabetic polyneuropathy: Secondary | ICD-10-CM | POA: Diagnosis not present

## 2017-04-29 DIAGNOSIS — K219 Gastro-esophageal reflux disease without esophagitis: Secondary | ICD-10-CM | POA: Diagnosis not present

## 2017-04-29 DIAGNOSIS — I132 Hypertensive heart and chronic kidney disease with heart failure and with stage 5 chronic kidney disease, or end stage renal disease: Secondary | ICD-10-CM | POA: Diagnosis not present

## 2017-04-29 DIAGNOSIS — E1122 Type 2 diabetes mellitus with diabetic chronic kidney disease: Secondary | ICD-10-CM | POA: Diagnosis not present

## 2017-04-29 DIAGNOSIS — M5136 Other intervertebral disc degeneration, lumbar region: Secondary | ICD-10-CM | POA: Diagnosis not present

## 2017-05-04 DIAGNOSIS — I132 Hypertensive heart and chronic kidney disease with heart failure and with stage 5 chronic kidney disease, or end stage renal disease: Secondary | ICD-10-CM | POA: Diagnosis not present

## 2017-05-04 DIAGNOSIS — M1A9XX0 Chronic gout, unspecified, without tophus (tophi): Secondary | ICD-10-CM | POA: Diagnosis not present

## 2017-05-04 DIAGNOSIS — M5136 Other intervertebral disc degeneration, lumbar region: Secondary | ICD-10-CM | POA: Diagnosis not present

## 2017-05-04 DIAGNOSIS — Z483 Aftercare following surgery for neoplasm: Secondary | ICD-10-CM | POA: Diagnosis not present

## 2017-05-04 DIAGNOSIS — E1122 Type 2 diabetes mellitus with diabetic chronic kidney disease: Secondary | ICD-10-CM | POA: Diagnosis not present

## 2017-05-04 DIAGNOSIS — E1142 Type 2 diabetes mellitus with diabetic polyneuropathy: Secondary | ICD-10-CM | POA: Diagnosis not present

## 2017-05-04 DIAGNOSIS — N185 Chronic kidney disease, stage 5: Secondary | ICD-10-CM | POA: Diagnosis not present

## 2017-05-04 DIAGNOSIS — K219 Gastro-esophageal reflux disease without esophagitis: Secondary | ICD-10-CM | POA: Diagnosis not present

## 2017-05-04 DIAGNOSIS — I5032 Chronic diastolic (congestive) heart failure: Secondary | ICD-10-CM | POA: Diagnosis not present

## 2017-05-05 DIAGNOSIS — E1142 Type 2 diabetes mellitus with diabetic polyneuropathy: Secondary | ICD-10-CM | POA: Diagnosis not present

## 2017-05-05 DIAGNOSIS — M1A9XX0 Chronic gout, unspecified, without tophus (tophi): Secondary | ICD-10-CM | POA: Diagnosis not present

## 2017-05-05 DIAGNOSIS — E1122 Type 2 diabetes mellitus with diabetic chronic kidney disease: Secondary | ICD-10-CM | POA: Diagnosis not present

## 2017-05-05 DIAGNOSIS — N185 Chronic kidney disease, stage 5: Secondary | ICD-10-CM | POA: Diagnosis not present

## 2017-05-05 DIAGNOSIS — I132 Hypertensive heart and chronic kidney disease with heart failure and with stage 5 chronic kidney disease, or end stage renal disease: Secondary | ICD-10-CM | POA: Diagnosis not present

## 2017-05-05 DIAGNOSIS — Z483 Aftercare following surgery for neoplasm: Secondary | ICD-10-CM | POA: Diagnosis not present

## 2017-05-05 DIAGNOSIS — K219 Gastro-esophageal reflux disease without esophagitis: Secondary | ICD-10-CM | POA: Diagnosis not present

## 2017-05-05 DIAGNOSIS — I5032 Chronic diastolic (congestive) heart failure: Secondary | ICD-10-CM | POA: Diagnosis not present

## 2017-05-05 DIAGNOSIS — M5136 Other intervertebral disc degeneration, lumbar region: Secondary | ICD-10-CM | POA: Diagnosis not present

## 2017-05-06 DIAGNOSIS — M1000 Idiopathic gout, unspecified site: Secondary | ICD-10-CM | POA: Diagnosis not present

## 2017-05-06 DIAGNOSIS — G6289 Other specified polyneuropathies: Secondary | ICD-10-CM | POA: Diagnosis not present

## 2017-05-06 DIAGNOSIS — E782 Mixed hyperlipidemia: Secondary | ICD-10-CM | POA: Diagnosis not present

## 2017-05-06 DIAGNOSIS — Z23 Encounter for immunization: Secondary | ICD-10-CM | POA: Diagnosis not present

## 2017-05-06 DIAGNOSIS — E1122 Type 2 diabetes mellitus with diabetic chronic kidney disease: Secondary | ICD-10-CM | POA: Diagnosis not present

## 2017-05-06 DIAGNOSIS — E1142 Type 2 diabetes mellitus with diabetic polyneuropathy: Secondary | ICD-10-CM | POA: Diagnosis not present

## 2017-05-11 DIAGNOSIS — E1142 Type 2 diabetes mellitus with diabetic polyneuropathy: Secondary | ICD-10-CM | POA: Diagnosis not present

## 2017-05-11 DIAGNOSIS — I5032 Chronic diastolic (congestive) heart failure: Secondary | ICD-10-CM | POA: Diagnosis not present

## 2017-05-11 DIAGNOSIS — K219 Gastro-esophageal reflux disease without esophagitis: Secondary | ICD-10-CM | POA: Diagnosis not present

## 2017-05-11 DIAGNOSIS — M5136 Other intervertebral disc degeneration, lumbar region: Secondary | ICD-10-CM | POA: Diagnosis not present

## 2017-05-11 DIAGNOSIS — N185 Chronic kidney disease, stage 5: Secondary | ICD-10-CM | POA: Diagnosis not present

## 2017-05-11 DIAGNOSIS — Z483 Aftercare following surgery for neoplasm: Secondary | ICD-10-CM | POA: Diagnosis not present

## 2017-05-11 DIAGNOSIS — I132 Hypertensive heart and chronic kidney disease with heart failure and with stage 5 chronic kidney disease, or end stage renal disease: Secondary | ICD-10-CM | POA: Diagnosis not present

## 2017-05-11 DIAGNOSIS — E1122 Type 2 diabetes mellitus with diabetic chronic kidney disease: Secondary | ICD-10-CM | POA: Diagnosis not present

## 2017-05-11 DIAGNOSIS — M1A9XX0 Chronic gout, unspecified, without tophus (tophi): Secondary | ICD-10-CM | POA: Diagnosis not present

## 2017-05-12 DIAGNOSIS — E1142 Type 2 diabetes mellitus with diabetic polyneuropathy: Secondary | ICD-10-CM | POA: Diagnosis not present

## 2017-05-12 DIAGNOSIS — M1A9XX0 Chronic gout, unspecified, without tophus (tophi): Secondary | ICD-10-CM | POA: Diagnosis not present

## 2017-05-12 DIAGNOSIS — M5136 Other intervertebral disc degeneration, lumbar region: Secondary | ICD-10-CM | POA: Diagnosis not present

## 2017-05-12 DIAGNOSIS — I5032 Chronic diastolic (congestive) heart failure: Secondary | ICD-10-CM | POA: Diagnosis not present

## 2017-05-12 DIAGNOSIS — I132 Hypertensive heart and chronic kidney disease with heart failure and with stage 5 chronic kidney disease, or end stage renal disease: Secondary | ICD-10-CM | POA: Diagnosis not present

## 2017-05-12 DIAGNOSIS — K219 Gastro-esophageal reflux disease without esophagitis: Secondary | ICD-10-CM | POA: Diagnosis not present

## 2017-05-12 DIAGNOSIS — E1122 Type 2 diabetes mellitus with diabetic chronic kidney disease: Secondary | ICD-10-CM | POA: Diagnosis not present

## 2017-05-12 DIAGNOSIS — Z483 Aftercare following surgery for neoplasm: Secondary | ICD-10-CM | POA: Diagnosis not present

## 2017-05-12 DIAGNOSIS — N185 Chronic kidney disease, stage 5: Secondary | ICD-10-CM | POA: Diagnosis not present

## 2017-05-13 DIAGNOSIS — N185 Chronic kidney disease, stage 5: Secondary | ICD-10-CM | POA: Diagnosis not present

## 2017-05-13 DIAGNOSIS — E1122 Type 2 diabetes mellitus with diabetic chronic kidney disease: Secondary | ICD-10-CM | POA: Diagnosis not present

## 2017-05-13 DIAGNOSIS — K219 Gastro-esophageal reflux disease without esophagitis: Secondary | ICD-10-CM | POA: Diagnosis not present

## 2017-05-13 DIAGNOSIS — E1142 Type 2 diabetes mellitus with diabetic polyneuropathy: Secondary | ICD-10-CM | POA: Diagnosis not present

## 2017-05-13 DIAGNOSIS — I132 Hypertensive heart and chronic kidney disease with heart failure and with stage 5 chronic kidney disease, or end stage renal disease: Secondary | ICD-10-CM | POA: Diagnosis not present

## 2017-05-13 DIAGNOSIS — Z483 Aftercare following surgery for neoplasm: Secondary | ICD-10-CM | POA: Diagnosis not present

## 2017-05-13 DIAGNOSIS — M5136 Other intervertebral disc degeneration, lumbar region: Secondary | ICD-10-CM | POA: Diagnosis not present

## 2017-05-13 DIAGNOSIS — I5032 Chronic diastolic (congestive) heart failure: Secondary | ICD-10-CM | POA: Diagnosis not present

## 2017-05-13 DIAGNOSIS — M1A9XX0 Chronic gout, unspecified, without tophus (tophi): Secondary | ICD-10-CM | POA: Diagnosis not present

## 2017-06-02 DIAGNOSIS — R339 Retention of urine, unspecified: Secondary | ICD-10-CM | POA: Diagnosis not present

## 2017-06-10 ENCOUNTER — Ambulatory Visit: Payer: Medicare Other | Admitting: Urology

## 2017-06-10 DIAGNOSIS — R339 Retention of urine, unspecified: Secondary | ICD-10-CM | POA: Diagnosis not present

## 2017-06-22 ENCOUNTER — Ambulatory Visit (INDEPENDENT_AMBULATORY_CARE_PROVIDER_SITE_OTHER): Payer: Medicare Other | Admitting: Urology

## 2017-06-22 DIAGNOSIS — R339 Retention of urine, unspecified: Secondary | ICD-10-CM

## 2017-07-04 DIAGNOSIS — R0602 Shortness of breath: Secondary | ICD-10-CM | POA: Diagnosis not present

## 2017-07-04 DIAGNOSIS — I951 Orthostatic hypotension: Secondary | ICD-10-CM | POA: Diagnosis not present

## 2017-07-20 ENCOUNTER — Ambulatory Visit: Payer: Medicare Other | Admitting: Urology

## 2017-08-01 ENCOUNTER — Encounter (HOSPITAL_COMMUNITY): Payer: Self-pay | Admitting: *Deleted

## 2017-08-01 ENCOUNTER — Emergency Department (HOSPITAL_COMMUNITY): Payer: Medicare Other

## 2017-08-01 ENCOUNTER — Other Ambulatory Visit: Payer: Self-pay

## 2017-08-01 ENCOUNTER — Inpatient Hospital Stay (HOSPITAL_COMMUNITY)
Admission: EM | Admit: 2017-08-01 | Discharge: 2017-08-05 | DRG: 178 | Disposition: A | Discharge status: Home Health | Payer: Medicare Other | Attending: Internal Medicine | Admitting: Internal Medicine

## 2017-08-01 DIAGNOSIS — E1121 Type 2 diabetes mellitus with diabetic nephropathy: Secondary | ICD-10-CM | POA: Diagnosis not present

## 2017-08-01 DIAGNOSIS — J849 Interstitial pulmonary disease, unspecified: Secondary | ICD-10-CM | POA: Diagnosis not present

## 2017-08-01 DIAGNOSIS — K219 Gastro-esophageal reflux disease without esophagitis: Secondary | ICD-10-CM | POA: Diagnosis not present

## 2017-08-01 DIAGNOSIS — M1990 Unspecified osteoarthritis, unspecified site: Secondary | ICD-10-CM | POA: Diagnosis present

## 2017-08-01 DIAGNOSIS — R0602 Shortness of breath: Secondary | ICD-10-CM | POA: Diagnosis not present

## 2017-08-01 DIAGNOSIS — E785 Hyperlipidemia, unspecified: Secondary | ICD-10-CM | POA: Diagnosis not present

## 2017-08-01 DIAGNOSIS — F418 Other specified anxiety disorders: Secondary | ICD-10-CM | POA: Diagnosis present

## 2017-08-01 DIAGNOSIS — Z7982 Long term (current) use of aspirin: Secondary | ICD-10-CM

## 2017-08-01 DIAGNOSIS — Z961 Presence of intraocular lens: Secondary | ICD-10-CM | POA: Diagnosis not present

## 2017-08-01 DIAGNOSIS — Z86010 Personal history of colonic polyps: Secondary | ICD-10-CM

## 2017-08-01 DIAGNOSIS — E1165 Type 2 diabetes mellitus with hyperglycemia: Secondary | ICD-10-CM

## 2017-08-01 DIAGNOSIS — R1314 Dysphagia, pharyngoesophageal phase: Secondary | ICD-10-CM | POA: Diagnosis present

## 2017-08-01 DIAGNOSIS — M109 Gout, unspecified: Secondary | ICD-10-CM | POA: Diagnosis not present

## 2017-08-01 DIAGNOSIS — Z90710 Acquired absence of both cervix and uterus: Secondary | ICD-10-CM

## 2017-08-01 DIAGNOSIS — E440 Moderate protein-calorie malnutrition: Secondary | ICD-10-CM | POA: Diagnosis not present

## 2017-08-01 DIAGNOSIS — I129 Hypertensive chronic kidney disease with stage 1 through stage 4 chronic kidney disease, or unspecified chronic kidney disease: Secondary | ICD-10-CM | POA: Diagnosis present

## 2017-08-01 DIAGNOSIS — K449 Diaphragmatic hernia without obstruction or gangrene: Secondary | ICD-10-CM | POA: Diagnosis present

## 2017-08-01 DIAGNOSIS — J690 Pneumonitis due to inhalation of food and vomit: Secondary | ICD-10-CM | POA: Diagnosis not present

## 2017-08-01 DIAGNOSIS — Z833 Family history of diabetes mellitus: Secondary | ICD-10-CM

## 2017-08-01 DIAGNOSIS — Z6827 Body mass index (BMI) 27.0-27.9, adult: Secondary | ICD-10-CM

## 2017-08-01 DIAGNOSIS — Z9842 Cataract extraction status, left eye: Secondary | ICD-10-CM | POA: Diagnosis not present

## 2017-08-01 DIAGNOSIS — E119 Type 2 diabetes mellitus without complications: Secondary | ICD-10-CM

## 2017-08-01 DIAGNOSIS — Z9841 Cataract extraction status, right eye: Secondary | ICD-10-CM | POA: Diagnosis not present

## 2017-08-01 DIAGNOSIS — N184 Chronic kidney disease, stage 4 (severe): Secondary | ICD-10-CM | POA: Diagnosis present

## 2017-08-01 DIAGNOSIS — Z7984 Long term (current) use of oral hypoglycemic drugs: Secondary | ICD-10-CM

## 2017-08-01 DIAGNOSIS — Z85828 Personal history of other malignant neoplasm of skin: Secondary | ICD-10-CM

## 2017-08-01 DIAGNOSIS — E1122 Type 2 diabetes mellitus with diabetic chronic kidney disease: Secondary | ICD-10-CM | POA: Diagnosis not present

## 2017-08-01 DIAGNOSIS — J189 Pneumonia, unspecified organism: Secondary | ICD-10-CM | POA: Diagnosis not present

## 2017-08-01 DIAGNOSIS — J9611 Chronic respiratory failure with hypoxia: Secondary | ICD-10-CM | POA: Diagnosis not present

## 2017-08-01 DIAGNOSIS — I2510 Atherosclerotic heart disease of native coronary artery without angina pectoris: Secondary | ICD-10-CM | POA: Diagnosis present

## 2017-08-01 DIAGNOSIS — I10 Essential (primary) hypertension: Secondary | ICD-10-CM | POA: Diagnosis not present

## 2017-08-01 DIAGNOSIS — I2584 Coronary atherosclerosis due to calcified coronary lesion: Secondary | ICD-10-CM

## 2017-08-01 DIAGNOSIS — Z86718 Personal history of other venous thrombosis and embolism: Secondary | ICD-10-CM

## 2017-08-01 DIAGNOSIS — N390 Urinary tract infection, site not specified: Secondary | ICD-10-CM | POA: Diagnosis present

## 2017-08-01 DIAGNOSIS — Z87891 Personal history of nicotine dependence: Secondary | ICD-10-CM

## 2017-08-01 DIAGNOSIS — K225 Diverticulum of esophagus, acquired: Secondary | ICD-10-CM | POA: Diagnosis present

## 2017-08-01 DIAGNOSIS — R000 Tachycardia, unspecified: Secondary | ICD-10-CM | POA: Diagnosis not present

## 2017-08-01 DIAGNOSIS — Z888 Allergy status to other drugs, medicaments and biological substances status: Secondary | ICD-10-CM

## 2017-08-01 DIAGNOSIS — E782 Mixed hyperlipidemia: Secondary | ICD-10-CM | POA: Diagnosis not present

## 2017-08-01 HISTORY — DX: Chronic kidney disease, stage 4 (severe): N18.4

## 2017-08-01 LAB — HEPATIC FUNCTION PANEL
ALT: 8 U/L — ABNORMAL LOW (ref 14–54)
AST: 15 U/L (ref 15–41)
Albumin: 3.1 g/dL — ABNORMAL LOW (ref 3.5–5.0)
Alkaline Phosphatase: 77 U/L (ref 38–126)
BILIRUBIN DIRECT: 0.1 mg/dL (ref 0.1–0.5)
Indirect Bilirubin: 0.4 mg/dL (ref 0.3–0.9)
Total Bilirubin: 0.5 mg/dL (ref 0.3–1.2)
Total Protein: 7.7 g/dL (ref 6.5–8.1)

## 2017-08-01 LAB — BASIC METABOLIC PANEL
ANION GAP: 11 (ref 5–15)
BUN: 20 mg/dL (ref 6–20)
CO2: 25 mmol/L (ref 22–32)
Calcium: 9.2 mg/dL (ref 8.9–10.3)
Chloride: 101 mmol/L (ref 101–111)
Creatinine, Ser: 2 mg/dL — ABNORMAL HIGH (ref 0.44–1.00)
GFR calc non Af Amer: 23 mL/min — ABNORMAL LOW (ref >60)
GFR, EST AFRICAN AMERICAN: 26 mL/min — AB (ref >60)
Glucose, Bld: 142 mg/dL — ABNORMAL HIGH (ref 65–99)
POTASSIUM: 3.8 mmol/L (ref 3.5–5.1)
SODIUM: 137 mmol/L (ref 135–145)

## 2017-08-01 LAB — CBC WITH DIFFERENTIAL/PLATELET
Basophils Absolute: 0 10*3/uL (ref 0.0–0.1)
Basophils Relative: 0 %
EOS ABS: 0.3 10*3/uL (ref 0.0–0.7)
Eosinophils Relative: 3 %
HEMATOCRIT: 43.2 % (ref 36.0–46.0)
HEMOGLOBIN: 13.8 g/dL (ref 12.0–15.0)
LYMPHS PCT: 26 %
Lymphs Abs: 2.8 10*3/uL (ref 0.7–4.0)
MCH: 30 pg (ref 26.0–34.0)
MCHC: 31.9 g/dL (ref 30.0–36.0)
MCV: 93.9 fL (ref 78.0–100.0)
Monocytes Absolute: 0.6 10*3/uL (ref 0.1–1.0)
Monocytes Relative: 5 %
Neutro Abs: 7.2 10*3/uL (ref 1.7–7.7)
Neutrophils Relative %: 66 %
Platelets: 278 10*3/uL (ref 150–400)
RBC: 4.6 MIL/uL (ref 3.87–5.11)
RDW: 15.4 % (ref 11.5–15.5)
WBC: 10.9 10*3/uL — AB (ref 4.0–10.5)

## 2017-08-01 LAB — TROPONIN I

## 2017-08-01 LAB — URINALYSIS, ROUTINE W REFLEX MICROSCOPIC
Bilirubin Urine: NEGATIVE
GLUCOSE, UA: NEGATIVE mg/dL
Ketones, ur: NEGATIVE mg/dL
Nitrite: NEGATIVE
PROTEIN: 100 mg/dL — AB
SPECIFIC GRAVITY, URINE: 1.014 (ref 1.005–1.030)
pH: 5 (ref 5.0–8.0)

## 2017-08-01 LAB — BRAIN NATRIURETIC PEPTIDE: B Natriuretic Peptide: 35 pg/mL (ref 0.0–100.0)

## 2017-08-01 MED ORDER — ONDANSETRON HCL 4 MG/2ML IJ SOLN
4.0000 mg | Freq: Once | INTRAMUSCULAR | Status: AC
  Administered 2017-08-01: 4 mg via INTRAVENOUS
  Filled 2017-08-01: qty 2

## 2017-08-01 MED ORDER — ONDANSETRON HCL 4 MG PO TABS: 4.0000 mg | ORAL_TABLET | Freq: Four times a day (QID) | ORAL | Status: DC | PRN

## 2017-08-01 MED ORDER — ASPIRIN EC 81 MG PO TBEC
81.0000 mg | DELAYED_RELEASE_TABLET | Freq: Every day | ORAL | Status: DC
  Administered 2017-08-02 – 2017-08-05 (×4): 81 mg via ORAL
  Filled 2017-08-01 (×5): qty 1

## 2017-08-01 MED ORDER — ONDANSETRON HCL 4 MG/2ML IJ SOLN: 4.0000 mg | Freq: Four times a day (QID) | INTRAMUSCULAR | Status: DC | PRN

## 2017-08-01 MED ORDER — BETHANECHOL CHLORIDE 10 MG PO TABS
5.0000 mg | ORAL_TABLET | Freq: Every day | ORAL | Status: DC
  Administered 2017-08-02 – 2017-08-05 (×4): 5 mg via ORAL
  Filled 2017-08-01 (×5): qty 0.5
  Filled 2017-08-01 (×2): qty 1

## 2017-08-01 MED ORDER — IPRATROPIUM-ALBUTEROL 0.5-2.5 (3) MG/3ML IN SOLN
3.0000 mL | Freq: Four times a day (QID) | RESPIRATORY_TRACT | Status: DC
  Administered 2017-08-01: 3 mL via RESPIRATORY_TRACT
  Filled 2017-08-01: qty 3

## 2017-08-01 MED ORDER — ALLOPURINOL 100 MG PO TABS
100.0000 mg | ORAL_TABLET | Freq: Every day | ORAL | Status: DC
  Administered 2017-08-02 – 2017-08-05 (×4): 100 mg via ORAL
  Filled 2017-08-01 (×6): qty 1

## 2017-08-01 MED ORDER — SIMVASTATIN 20 MG PO TABS
40.0000 mg | ORAL_TABLET | Freq: Every evening | ORAL | Status: DC
  Administered 2017-08-02 – 2017-08-05 (×4): 40 mg via ORAL
  Filled 2017-08-01 (×4): qty 2

## 2017-08-01 MED ORDER — OXYCODONE-ACETAMINOPHEN 5-325 MG PO TABS: 1.0000 | ORAL_TABLET | ORAL | Status: DC | PRN

## 2017-08-01 MED ORDER — LORAZEPAM 0.5 MG PO TABS
0.5000 mg | ORAL_TABLET | Freq: Four times a day (QID) | ORAL | Status: DC | PRN
Start: 2017-08-01 — End: 2017-08-05
  Administered 2017-08-03 – 2017-08-04 (×2): 0.5 mg via ORAL
  Filled 2017-08-01 (×3): qty 1

## 2017-08-01 MED ORDER — LINAGLIPTIN 5 MG PO TABS
5.0000 mg | ORAL_TABLET | Freq: Every day | ORAL | Status: DC
  Administered 2017-08-02: 5 mg via ORAL
  Filled 2017-08-01 (×3): qty 1

## 2017-08-01 MED ORDER — ALBUTEROL SULFATE (2.5 MG/3ML) 0.083% IN NEBU: 2.5000 mg | INHALATION_SOLUTION | RESPIRATORY_TRACT | Status: DC | PRN

## 2017-08-01 MED ORDER — SODIUM CHLORIDE 0.9 % IV SOLN
1.0000 g | INTRAVENOUS | Status: DC
  Administered 2017-08-01: 1 g via INTRAVENOUS
  Filled 2017-08-01 (×2): qty 10

## 2017-08-01 MED ORDER — ESCITALOPRAM OXALATE 10 MG PO TABS
10.0000 mg | ORAL_TABLET | Freq: Every day | ORAL | Status: DC
  Administered 2017-08-02 – 2017-08-05 (×4): 10 mg via ORAL
  Filled 2017-08-01 (×6): qty 1

## 2017-08-01 MED ORDER — DOXYCYCLINE HYCLATE 100 MG PO TABS
100.0000 mg | ORAL_TABLET | Freq: Once | ORAL | Status: DC
  Filled 2017-08-01: qty 1

## 2017-08-01 MED ORDER — ACETAMINOPHEN 325 MG PO TABS: 650.0000 mg | ORAL_TABLET | Freq: Four times a day (QID) | ORAL | Status: DC | PRN

## 2017-08-01 MED ORDER — FAMOTIDINE 20 MG PO TABS
20.0000 mg | ORAL_TABLET | Freq: Two times a day (BID) | ORAL | Status: DC
  Administered 2017-08-01: 20 mg via ORAL

## 2017-08-01 MED ORDER — ENOXAPARIN SODIUM 30 MG/0.3ML ~~LOC~~ SOLN
30.0000 mg | SUBCUTANEOUS | Status: DC
  Administered 2017-08-01 – 2017-08-04 (×4): 30 mg via SUBCUTANEOUS
  Filled 2017-08-01 (×4): qty 0.3

## 2017-08-01 MED ORDER — ALBUTEROL SULFATE (2.5 MG/3ML) 0.083% IN NEBU: 2.5000 mg | INHALATION_SOLUTION | Freq: Four times a day (QID) | RESPIRATORY_TRACT | Status: DC

## 2017-08-01 MED ORDER — IPRATROPIUM-ALBUTEROL 0.5-2.5 (3) MG/3ML IN SOLN
3.0000 mL | Freq: Three times a day (TID) | RESPIRATORY_TRACT | Status: DC
  Administered 2017-08-02 – 2017-08-03 (×3): 3 mL via RESPIRATORY_TRACT
  Filled 2017-08-01 (×3): qty 3

## 2017-08-01 MED ORDER — FAMOTIDINE 20 MG PO TABS
ORAL_TABLET | ORAL | Status: AC
  Filled 2017-08-01: qty 1

## 2017-08-01 MED ORDER — SODIUM CHLORIDE 0.9 % IV SOLN
500.0000 mg | INTRAVENOUS | Status: DC
  Administered 2017-08-02 – 2017-08-04 (×4): 500 mg via INTRAVENOUS
  Filled 2017-08-01 (×7): qty 500

## 2017-08-01 MED ORDER — GLIPIZIDE 5 MG PO TABS
5.0000 mg | ORAL_TABLET | Freq: Two times a day (BID) | ORAL | Status: DC
  Filled 2017-08-01 (×3): qty 1

## 2017-08-01 MED ORDER — IPRATROPIUM BROMIDE 0.02 % IN SOLN: 0.5000 mg | Freq: Four times a day (QID) | RESPIRATORY_TRACT | Status: DC

## 2017-08-01 MED ORDER — ACETAMINOPHEN 650 MG RE SUPP: 650.0000 mg | Freq: Four times a day (QID) | RECTAL | Status: DC | PRN

## 2017-08-01 NOTE — ED Provider Notes (Signed)
[facility] EMERGENCY DEPARTMENT Provider Note   CSN: [id number] Arrival date & time: 08/01/17  1552     History   Chief Complaint Chief Complaint  Patient presents with  . Shortness of Breath    HPI Courtney Madden is a 77 y.o. female.  Patient complains of cough and shortness of breath for 2 weeks  The history is provided by the patient. No language interpreter was used.  Shortness of Breath  This is a new problem. The problem occurs continuously.The current episode started more than 1 week ago. The problem has not changed since onset.Pertinent negatives include no fever, no headaches, no cough, no chest pain, no abdominal pain and no rash. It is unknown what precipitated the problem. Risk factors: Diabetes. She has tried nothing for the symptoms. The treatment provided no relief.    Past Medical History:  Diagnosis Date  . Anxiety   . Arthritis   . Cancer ([place])    skin cancer- face  . Colon polyps   . Depression   . Diabetes ([place])   . DVT (deep venous thrombosis) ([place])   . Dysrhythmia   . GERD (gastroesophageal reflux disease)   . Gout   . Hiatal hernia    patient unaware  . HTN (hypertension)   . Hyperlipidemia   . Obesity   . Pneumonia   . Shortness of breath dyspnea    currently- 04/2014  . Stage 4 chronic kidney disease ([place])   . Status post dilation of esophageal narrowing   . Swallowing difficulty   . Tachycardia     Patient Active Problem List   Diagnosis Date Noted  . Atypical pneumonia 08/01/2017  . Acquired esophageal diverticulum 04/19/2014  . Zenker's diverticulum 04/09/2013  . Personal history of colonic polyps 03/12/2013  . ILD (interstitial lung disease) ([place]) 02/09/2013  . Coronary artery calcification 02/09/2013    Past Surgical History:  Procedure Laterality Date  . APPENDECTOMY    . BREAST CYST EXCISION Left   . ESOPHAGOSCOPY N/A 04/19/2014   Procedure: ESOPHAGOSCOPY;  Surgeon: [name], MD;  Location: [place];  Service:  ENT;  Laterality: N/A;  . EYE SURGERY Bilateral    Cataract withe implants  . Ovarian Mass Excision    . SKIN CANCER EXCISION    . TOTAL ABDOMINAL HYSTERECTOMY    . ZENKER'S DIVERTICULECTOMY N/A 04/19/2014   Procedure: ENDOSCOPY REPAIR ZENKER'S DIVERTICULECTOMY;  Surgeon: [name], MD;  Location: [place];  Service: ENT;  Laterality: N/A;     OB History   None      Home Medications    Prior to Admission medications   Medication Sig Start Date End Date Taking? Authorizing Provider  allopurinol (ZYLOPRIM) 100 MG tablet Take 100 mg by mouth daily.   Yes [provider]  aspirin 81 MG tablet Take 81 mg by mouth daily.   Yes [provider]  bethanechol (URECHOLINE) 5 MG tablet Take 5 mg by mouth daily. 07/06/17  Yes [provider]  escitalopram (LEXAPRO) 10 MG tablet TAKE ONE TABLET BY MOUTH DAILY FOR DEPRESSION 07/15/17  Yes [provider]  glipiZIDE (GLUCOTROL) 5 MG tablet Take 5 mg by mouth 2 (two) times daily. 07/25/17  Yes [provider]  LORazepam (ATIVAN) 0.5 MG tablet Take 0.5 mg by mouth as needed for anxiety.   Yes [provider]  oxyCODONE-acetaminophen (PERCOCET/ROXICET) 5-325 MG per tablet Take 1 tablet by mouth every 4 (four) hours as needed for pain.   Yes [provider]  ranitidine (ZANTAC) 150 MG tablet Take 150 mg by mouth 2 (two) times daily. 05/07/17  Yes [provider]  simvastatin (ZOCOR) 40 MG tablet Take 40 mg by mouth every evening.   Yes [provider]  sitaGLIPtin (JANUVIA) 100 MG tablet Take 100 mg by mouth daily.   Yes [provider]    Family History Family History  Problem Relation Age of Onset  . Diabetes Son        x 2 sons    Social History Social History   Tobacco Use  . Smoking status: Former Smoker    Packs/day: 1.00    Years: 25.00    Pack years: 25.00    Types: Cigarettes    Last attempt to quit: 05/10/1994    Years since quitting: 23.2  .  Smokeless tobacco: Never Used  Substance Use Topics  . Alcohol use: No  . Drug use: No     Allergies   Ace inhibitors; Diovan [valsartan]; and Zocor [simvastatin]   Review of Systems Review of Systems  Constitutional: Negative for appetite change, fatigue and fever.  HENT: Negative for congestion, ear discharge and sinus pressure.   Eyes: Negative for discharge.  Respiratory: Positive for shortness of breath. Negative for cough.   Cardiovascular: Negative for chest pain.  Gastrointestinal: Negative for abdominal pain and diarrhea.  Genitourinary: Negative for frequency and hematuria.  Musculoskeletal: Negative for back pain.  Skin: Negative for rash.  Neurological: Negative for seizures and headaches.  Psychiatric/Behavioral: Negative for hallucinations.     Physical Exam Updated Vital Signs BP 122/80   Pulse 100   Resp (!) 24   Ht 5\' 5"  (1.651 m)   Wt 73.9 kg (163 lb)   SpO2 96%   BMI 27.12 kg/m   Physical Exam  Constitutional: She is oriented to person, place, and time. She appears well-developed.  HENT:  Head: Normocephalic.  Eyes: Conjunctivae and EOM are normal. No scleral icterus.  Neck: Neck supple. No thyromegaly present.  Cardiovascular: Normal rate and regular rhythm. Exam reveals no gallop and no friction rub.  No murmur heard. Pulmonary/Chest: No stridor. She has no wheezes. She has no rales. She exhibits no tenderness.  Abdominal: She exhibits no distension. There is no tenderness. There is no rebound.  Musculoskeletal: Normal range of motion. She exhibits no edema.  Lymphadenopathy:    She has no cervical adenopathy.  Neurological: She is oriented to person, place, and time. She exhibits normal muscle tone. Coordination normal.  Skin: No rash noted. No erythema.  Psychiatric: She has a normal mood and affect. Her behavior is normal.     ED Treatments / Results  Labs (all labs ordered are listed, but only abnormal results are displayed) Labs  Reviewed  CBC WITH DIFFERENTIAL/PLATELET - Abnormal; Notable for the following components:      Result Value   WBC 10.9 (*)    All other components within normal limits  BASIC METABOLIC PANEL - Abnormal; Notable for the following components:   Glucose, Bld 142 (*)    Creatinine, Ser 2.00 (*)    GFR calc non Af Amer 23 (*)    GFR calc Af Amer 26 (*)    All other components within normal limits  HEPATIC FUNCTION PANEL - Abnormal; Notable for the following components:   Albumin 3.1 (*)    ALT 8 (*)    All other components within normal limits  URINALYSIS, ROUTINE W REFLEX MICROSCOPIC - Abnormal; Notable for the  following components:   APPearance TURBID (*)    Hgb urine dipstick SMALL (*)    Protein, ur 100 (*)    Leukocytes, UA MODERATE (*)    Bacteria, UA FEW (*)    Squamous Epithelial / LPF 6-30 (*)    Non Squamous Epithelial 0-5 (*)    All other components within normal limits  URINE CULTURE  TROPONIN I  BRAIN NATRIURETIC PEPTIDE    EKG EKG Interpretation  Date/Time:  Monday August 01 2017 16:03:25 EDT Ventricular Rate:  125 PR Interval:  210 QRS Duration: 54 QT Interval:  296 QTC Calculation: 427 R Axis:   29 Text Interpretation:  Sinus tachycardia with 1st degree A-V block with occasional Premature ventricular complexes and Fusion complexes Low voltage QRS Nonspecific ST abnormality Abnormal ECG Confirmed by Milton Ferguson [phone]) on 08/01/2017 8:47:00 PM   Radiology Dg Chest 2 View  Result Date: 08/01/2017 CLINICAL DATA:  Worsening shortness of breath. EXAM: CHEST - 2 VIEW COMPARISON:  Chest x-ray dated March 18, 2017. FINDINGS: The heart size and mediastinal contours are within normal limits. New diffuse fine reticular interstitial thickening throughout both lungs. Right basilar atelectasis. No consolidation, pleural effusion, or pneumothorax. No acute osseous abnormality. IMPRESSION: 1. New diffuse fine reticular interstitial thickening throughout both lungs,  suspicious for interstitial edema or interstitial pneumonitis related to an infectious or inflammatory etiology. Electronically Signed   By: Titus Dubin M.D.   On: 08/01/2017 16:49    Procedures Procedures (including critical care time)  Medications Ordered in ED Medications - No data to display   Initial Impression / Assessment and Plan / ED Course  I have reviewed the triage vital signs and the nursing notes.  Pertinent labs & imaging results that were available during my care of the patient were reviewed by me and considered in my medical decision making (see chart for details).     Patient with atypical pneumonia.  Possible urinary tract infection.  Patient will be admitted to the hospitalist treated with antibiotics  Final Clinical Impressions(s) / ED Diagnoses   Final diagnoses:  Atypical pneumonia    ED Discharge Orders    None       Milton Ferguson, MD 08/01/17 2109

## 2017-08-01 NOTE — ED Triage Notes (Signed)
Pt c/o SOB, generalized weakness, dry cough x couple of weeks. Denies fever. Pt was seen by PCP today and was trying to get pt direct admitted into [facility] but no beds were available and therefore pt was sent to [facility] for evaluation.

## 2017-08-01 NOTE — H&P (Signed)
History and Physical    Courtney Madden IOX:[id number] DOB: [birth year] DOA: 08/01/2017  PCP: Caryl Bis, MD   Patient coming from: Home via PCP  I have personally briefly reviewed patient's old medical records in [place]  Chief Complaint: Shortness of breath.  HPI: Courtney Madden is a 77 y.o. female with medical history significant of anxiety, depression, facial skin cancer, colon polyps, type 2 diabetes, history of DVT,  GERD/hiatal hernia, gout, hypertension, hyperlipidemia, obesity, history of pneumonia, stage IV chronic kidney disease, history of dysphagia with esophageal endoscopic dilatation, tachycardia who is coming to the emergency room due to progressively worse shortness of breath, dry cough, weakness and fatigue for the past 2 weeks.  She tried to see her PCP earlier last week, but states that her PCP was away and she was unable to see him.  Finally today, her PCP evaluated her and try to direct admit her to [facility], but then referred her to the ED at [facility], since there were no beds available for the direct admission.  She denies fever, but complains of chills.  No sore throat or rhinorrhea.  No hemoptysis, chest pain, dizziness, diaphoresis, PND, orthopnea or recent pitting edema of the lower extremities.  She complains of frequent palpitations.  No abdominal pain, nausea, vomiting, diarrhea, constipation, melena or hematochezia.  Denies dysuria or hematuria, but complains of nocturia and frequency.  No heat or cold intolerance.  Denies skin rashes or pruritus.  ED Course: Initial vital signs pulse 81, respirations 19, blood pressure 104/78 mmHg and O2 sat 97% on room air.  The patient received supplemental oxygen and was ordered doxycycline 100 mg p.o. x1 in the ED.  However, she is unable to swallow pills with ease.  Ceftriaxone and Zithromax were started.  Urinalysis showed hemoglobinuria, proteinuria, pyuria and bacteriuria.  CBC showed a white count of 10.9 with a  normal differential.  Hemoglobin 13.8 g/dL and platelets 278.  LFTs show an albumin of 3.1 g/dL, but all other values were normal.    BMP shows a glucose of 142 and creatinine of 2.00 mg/dL, but all other values are normal.  EKG was sinus tachycardia with some PVCs. Troponin level was normal.  Over 3 years ago, her creatinine was 1.6 mg/dL.  Her chest radiograph showed new diffuse fine reticular interstitial thickening throughout both lungs, suspicious for interstitial edema or interstitial pneumonitis related to an infectious or inflammatory etiology.  Please see images and full radiology report for further detail  Review of Systems: As per HPI otherwise 10 point review of systems negative.    Past Medical History:  Diagnosis Date  . Anxiety   . Arthritis   . Cancer ([place])    skin cancer- face  . Colon polyps   . Depression   . Diabetes ([place])   . DVT (deep venous thrombosis) ([place])   . Dysrhythmia   . GERD (gastroesophageal reflux disease)   . Gout   . Hiatal hernia    patient unaware  . HTN (hypertension)   . Hyperlipidemia   . Obesity   . Pneumonia   . Shortness of breath dyspnea    currently- 04/2014  . Stage 4 chronic kidney disease ([place])   . Status post dilation of esophageal narrowing   . Swallowing difficulty   . Tachycardia     Past Surgical History:  Procedure Laterality Date  . APPENDECTOMY    . BREAST CYST EXCISION Left   . ESOPHAGOSCOPY N/A 04/19/2014  Procedure: ESOPHAGOSCOPY;  Surgeon: [name], MD;  Location: [place];  Service: ENT;  Laterality: N/A;  . EYE SURGERY Bilateral    Cataract withe implants  . Ovarian Mass Excision    . SKIN CANCER EXCISION    . TOTAL ABDOMINAL HYSTERECTOMY    . ZENKER'S DIVERTICULECTOMY N/A 04/19/2014   Procedure: ENDOSCOPY REPAIR ZENKER'S DIVERTICULECTOMY;  Surgeon: [name], MD;  Location: [place];  Service: ENT;  Laterality: N/A;     reports that she quit smoking about 23 years ago. Her smoking use included  cigarettes. She has a 25.00 pack-year smoking history. She has never used smokeless tobacco. She reports that she does not drink alcohol or use drugs.  Allergies  Allergen Reactions  . Ace Inhibitors Cough  . Diovan [Valsartan]     Headache   . Zocor [Simvastatin]     Muscle pain    Family History  Problem Relation Age of Onset  . Diabetes Son        x 2 sons     Prior to Admission medications   Medication Sig Start Date End Date Taking? Authorizing Provider  allopurinol (ZYLOPRIM) 100 MG tablet Take 100 mg by mouth daily.   Yes [provider]  aspirin 81 MG tablet Take 81 mg by mouth daily.   Yes [provider]  bethanechol (URECHOLINE) 5 MG tablet Take 5 mg by mouth daily. 07/06/17  Yes [provider]  escitalopram (LEXAPRO) 10 MG tablet TAKE ONE TABLET BY MOUTH DAILY FOR DEPRESSION 07/15/17  Yes [provider]  glipiZIDE (GLUCOTROL) 5 MG tablet Take 5 mg by mouth 2 (two) times daily. 07/25/17  Yes [provider]  LORazepam (ATIVAN) 0.5 MG tablet Take 0.5 mg by mouth as needed for anxiety.   Yes [provider]  oxyCODONE-acetaminophen (PERCOCET/ROXICET) 5-325 MG per tablet Take 1 tablet by mouth every 4 (four) hours as needed for pain.   Yes [provider]  ranitidine (ZANTAC) 150 MG tablet Take 150 mg by mouth 2 (two) times daily. 05/07/17  Yes [provider]  simvastatin (ZOCOR) 40 MG tablet Take 40 mg by mouth every evening.   Yes [provider]  sitaGLIPtin (JANUVIA) 100 MG tablet Take 100 mg by mouth daily.   Yes [provider]    Physical Exam: Vitals:   08/01/17 1904 08/01/17 1912 08/01/17 1915 08/01/17 2030  BP: 109/87   122/80  Pulse:  92 92 100  Resp: 20 (!) 33 (!) 26 (!) 24  SpO2:  96% 95% 96%  Weight:      Height:        Constitutional: NAD, calm, comfortable Eyes: PERRL, lids and conjunctivae normal ENMT: Mucous membranes are moist. Posterior pharynx clear of  any exudate or lesions. Neck: normal, supple, no masses, no thyromegaly Respiratory: Decreased breath sounds with bilateral wheezing, scattered crackles. Normal respiratory effort. No accessory muscle use.  Cardiovascular: Tachycardic at 102 bpm, no murmurs / rubs / gallops. No extremity edema. 2+ pedal pulses. No carotid bruits.  Abdomen: Soft, no tenderness, no masses palpated. No hepatosplenomegaly. Bowel sounds positive.  Musculoskeletal: no clubbing / cyanosis. Good ROM, no contractures. Normal muscle tone.  Skin: no rashes, lesions, ulcers on very limited dermatological examination. Neurologic: CN 2-12 grossly intact. Sensation intact, DTR normal. Strength 5/5 in all 4.  Psychiatric: Normal judgment and insight. Alert and oriented x 3. Normal mood.    Labs on Admission: I have personally reviewed following labs and imaging studies  CBC:  Recent Labs  Lab 08/01/17 1701  WBC 10.9*  NEUTROABS 7.2  HGB 13.8  HCT 43.2  MCV 93.9  PLT 703   Basic Metabolic Panel: Recent Labs  Lab 08/01/17 1701  NA 137  K 3.8  CL 101  CO2 25  GLUCOSE 142*  BUN 20  CREATININE 2.00*  CALCIUM 9.2   GFR: Estimated Creatinine Clearance: 23.3 mL/min (A) (by C-G formula based on SCr of 2 mg/dL (H)). Liver Function Tests: Recent Labs  Lab 08/01/17 1701  AST 15  ALT 8*  ALKPHOS 77  BILITOT 0.5  PROT 7.7  ALBUMIN 3.1*   No results for input(s): LIPASE, AMYLASE in the last 168 hours. No results for input(s): AMMONIA in the last 168 hours. Coagulation Profile: No results for input(s): INR, PROTIME in the last 168 hours. Cardiac Enzymes: Recent Labs  Lab 08/01/17 1701  TROPONINI <0.03   BNP (last 3 results) No results for input(s): PROBNP in the last 8760 hours. HbA1C: No results for input(s): HGBA1C in the last 72 hours. CBG: No results for input(s): GLUCAP in the last 168 hours. Lipid Profile: No results for input(s): CHOL, HDL, LDLCALC, TRIG, CHOLHDL, LDLDIRECT in the last 72  hours. Thyroid Function Tests: No results for input(s): TSH, T4TOTAL, FREET4, T3FREE, THYROIDAB in the last 72 hours. Anemia Panel: No results for input(s): VITAMINB12, FOLATE, FERRITIN, TIBC, IRON, RETICCTPCT in the last 72 hours. Urine analysis:    Component Value Date/Time   COLORURINE YELLOW 08/01/2017 1755   APPEARANCEUR TURBID (A) 08/01/2017 1755   LABSPEC 1.014 08/01/2017 1755   PHURINE 5.0 08/01/2017 1755   GLUCOSEU NEGATIVE 08/01/2017 1755   HGBUR SMALL (A) 08/01/2017 1755   BILIRUBINUR NEGATIVE 08/01/2017 1755   KETONESUR NEGATIVE 08/01/2017 1755   PROTEINUR 100 (A) 08/01/2017 1755   NITRITE NEGATIVE 08/01/2017 1755   LEUKOCYTESUR MODERATE (A) 08/01/2017 1755    Radiological Exams on Admission: Dg Chest 2 View  Result Date: 08/01/2017 CLINICAL DATA:  Worsening shortness of breath. EXAM: CHEST - 2 VIEW COMPARISON:  Chest x-ray dated March 18, 2017. FINDINGS: The heart size and mediastinal contours are within normal limits. New diffuse fine reticular interstitial thickening throughout both lungs. Right basilar atelectasis. No consolidation, pleural effusion, or pneumothorax. No acute osseous abnormality. IMPRESSION: 1. New diffuse fine reticular interstitial thickening throughout both lungs, suspicious for interstitial edema or interstitial pneumonitis related to an infectious or inflammatory etiology. Electronically Signed   By: Titus Dubin M.D.   On: 08/01/2017 16:49    EKG: Independently reviewed.  Vent. rate 125 BPM PR interval 210 ms QRS duration 54 ms QT/QTc 296/427 ms P-R-T axes 86 29 63 Sinus tachycardia with 1st degree A-V block with occasional Premature ventricular complexes and Fusion complexes Low voltage QRS Nonspecific ST abnormality Abnormal ECG  Assessment/Plan Principal Problem:   Atypical pneumonia Admit to telemetry/inpatient. Continue supplemental oxygen. DuoNeb every 6 hours. Albuterol nebs every 4 hours as needed. Start Zithromax 500  mg IVPB every 24 hours. On ceftriaxone 1 g IVPB every 24 hours for pneumonia and also UTI. Check sputum Gram stain culture and sensitivity. Check strep pneumonia and Legionella urinary antigens.  Active Problems:   Acute lower UTI Ceftriaxone 1 g IVPB every 24 hours. Follow-up urine culture and sensitivity.    ILD (interstitial lung disease) ([place]) Continue supplemental oxygen. Continue bronchodilators.    Coronary artery calcification Continue aspirin and simvastatin.    Hyperlipidemia Continue simvastatin 40 mg p.o. daily. Monitor LFTs as needed.    GERD (gastroesophageal  reflux disease) Continue ranitidine 150 mg p.o. twice daily or formulary equivalent.    Depression with anxiety Continue Lexapro 10 mg p.o. daily. Continue low-dose oral Ativan as needed for anxiety.    Gout Continue allopurinol 100 mg p.o. daily.    Essential hypertension Not on medical therapy. Continue lifestyle modifications. Monitor blood pressure.    Type 2 diabetes mellitus (HCC) Carbohydrate modified diet. Continue glipizide 5 mg p.o. twice daily. Continue Januvia 100 mg p.o. daily or formulary equivalent.    Stage 4 chronic kidney disease (H[facility]) Monitor renal function and electrolytes.    DVT prophylaxis: Lovenox SQ. Code Status: Full code. Family Communication:  Disposition Plan: Admit for IV antibiotic therapy for 2-3 days. Consults called:  Admission status: Inpatient/telemetry.   Reubin Milan MD [facility] Pager [phone].  If 7PM-7AM, please contact night-coverage [email or web address] Password [facility]  08/01/2017, 10:02 PM

## 2017-08-02 DIAGNOSIS — J9611 Chronic respiratory failure with hypoxia: Secondary | ICD-10-CM

## 2017-08-02 DIAGNOSIS — K219 Gastro-esophageal reflux disease without esophagitis: Secondary | ICD-10-CM

## 2017-08-02 DIAGNOSIS — J849 Interstitial pulmonary disease, unspecified: Secondary | ICD-10-CM

## 2017-08-02 DIAGNOSIS — E1122 Type 2 diabetes mellitus with diabetic chronic kidney disease: Secondary | ICD-10-CM

## 2017-08-02 LAB — BASIC METABOLIC PANEL
Anion gap: 10 (ref 5–15)
BUN: 19 mg/dL (ref 6–20)
CHLORIDE: 105 mmol/L (ref 101–111)
CO2: 26 mmol/L (ref 22–32)
CREATININE: 2.07 mg/dL — AB (ref 0.44–1.00)
Calcium: 9.1 mg/dL (ref 8.9–10.3)
GFR calc Af Amer: 25 mL/min — ABNORMAL LOW (ref >60)
GFR calc non Af Amer: 22 mL/min — ABNORMAL LOW (ref >60)
Glucose, Bld: 83 mg/dL (ref 65–99)
POTASSIUM: 4.4 mmol/L (ref 3.5–5.1)
Sodium: 141 mmol/L (ref 135–145)

## 2017-08-02 LAB — CBC WITH DIFFERENTIAL/PLATELET
BASOS ABS: 0 10*3/uL (ref 0.0–0.1)
BASOS PCT: 0 %
EOS ABS: 0.4 10*3/uL (ref 0.0–0.7)
Eosinophils Relative: 4 %
HCT: 41.3 % (ref 36.0–46.0)
HEMOGLOBIN: 12.8 g/dL (ref 12.0–15.0)
Lymphocytes Relative: 40 %
Lymphs Abs: 3.3 10*3/uL (ref 0.7–4.0)
MCH: 29.3 pg (ref 26.0–34.0)
MCHC: 31 g/dL (ref 30.0–36.0)
MCV: 94.5 fL (ref 78.0–100.0)
Monocytes Absolute: 0.7 10*3/uL (ref 0.1–1.0)
Monocytes Relative: 9 %
NEUTROS PCT: 47 %
Neutro Abs: 3.9 10*3/uL (ref 1.7–7.7)
Platelets: 253 10*3/uL (ref 150–400)
RBC: 4.37 MIL/uL (ref 3.87–5.11)
RDW: 15.4 % (ref 11.5–15.5)
WBC: 8.4 10*3/uL (ref 4.0–10.5)

## 2017-08-02 LAB — GLUCOSE, CAPILLARY
GLUCOSE-CAPILLARY: 136 mg/dL — AB (ref 65–99)
GLUCOSE-CAPILLARY: 136 mg/dL — AB (ref 65–99)
Glucose-Capillary: 150 mg/dL — ABNORMAL HIGH (ref 65–99)

## 2017-08-02 LAB — HEMOGLOBIN A1C
Hgb A1c MFr Bld: 8.7 % — ABNORMAL HIGH (ref 4.8–5.6)
Mean Plasma Glucose: 202.99 mg/dL

## 2017-08-02 MED ORDER — GLIPIZIDE 5 MG PO TABS
5.0000 mg | ORAL_TABLET | Freq: Two times a day (BID) | ORAL | Status: DC
  Administered 2017-08-02: 5 mg via ORAL
  Filled 2017-08-02 (×4): qty 1

## 2017-08-02 MED ORDER — PIPERACILLIN-TAZOBACTAM 3.375 G IVPB
3.3750 g | Freq: Three times a day (TID) | INTRAVENOUS | Status: DC
  Administered 2017-08-02 – 2017-08-04 (×5): 3.375 g via INTRAVENOUS
  Filled 2017-08-02 (×6): qty 50

## 2017-08-02 MED ORDER — INSULIN ASPART 100 UNIT/ML ~~LOC~~ SOLN
0.0000 [IU] | Freq: Three times a day (TID) | SUBCUTANEOUS | Status: DC
  Administered 2017-08-03: 5 [IU] via SUBCUTANEOUS
  Administered 2017-08-04: 3 [IU] via SUBCUTANEOUS
  Administered 2017-08-04 – 2017-08-05 (×2): 2 [IU] via SUBCUTANEOUS
  Administered 2017-08-05: 3 [IU] via SUBCUTANEOUS

## 2017-08-02 MED ORDER — ORAL CARE MOUTH RINSE
15.0000 mL | Freq: Two times a day (BID) | OROMUCOSAL | Status: DC
  Administered 2017-08-04 – 2017-08-05 (×2): 15 mL via OROMUCOSAL

## 2017-08-02 MED ORDER — INSULIN DETEMIR 100 UNIT/ML ~~LOC~~ SOLN
5.0000 [IU] | Freq: Every day | SUBCUTANEOUS | Status: DC
  Administered 2017-08-02 – 2017-08-03 (×2): 5 [IU] via SUBCUTANEOUS
  Filled 2017-08-02 (×6): qty 0.05

## 2017-08-02 MED ORDER — FAMOTIDINE 20 MG PO TABS
20.0000 mg | ORAL_TABLET | Freq: Every day | ORAL | Status: DC
  Administered 2017-08-02 – 2017-08-05 (×4): 20 mg via ORAL
  Filled 2017-08-02 (×4): qty 1

## 2017-08-02 MED ORDER — SODIUM CHLORIDE 0.9 % IV SOLN
INTRAVENOUS | Status: AC
  Administered 2017-08-02: 13:00:00 via INTRAVENOUS

## 2017-08-02 NOTE — Evaluation (Signed)
Physical Therapy Evaluation Patient Details Name: Courtney Madden MRN: [id number] DOB: [birth year] Today's Date: 08/02/2017   History of Present Illness  Courtney Madden is a 77yo white female who comes to [facility] on 3/25 after SOB, dry cough, fatigue, and weakness x 2 weeks: pt admitted with PNA and UTI. At baseline pt AMB mostly household distances, but out to church 1x weekly with [facility]. Granddaughter assist with dressign bathing when ill or weak. Granddaughter assists with IADL and househwork. No falls recently.   Clinical Impression  Pt admitted with above diagnosis. Pt currently with functional limitations due to the deficits listed below (see "PT Problem List"). Upon entry, the patient is received semirecumbent in bed, no family/caregiver present. The pt is awake and agreeable to participate. No acute distress noted at this time.  The pt is alert and oriented x3, pleasant, conversational, and following simple and multi-step commands consistently. Pt received on room air satting well, with short desaturation associated with AMB.  Position/Activity: Flow Rate:  SpO2:  Heartrate  Resting, supine  room air  93% 88bpm  s/p 150ft AMB room air  86% 115bpm  2 minute seated recovery  room air  94% 105bpm  Functional mobility assessment demonstrates moderate gross strength impairment, pt subjectively reports weakness in legs as acutely impaired,  But all mobility is performed at Supervision to minGuard assist. Empirically, the patient demonstrates increased risk of recurrent falls AEB gait speed <0.105m/s, but no overt LOB demonstrated in session, AMB 37ft s AD, and no recent falls history. Pt will benefit from skilled PT intervention to increase independence and safety with basic mobility in preparation for discharge to the venue listed below.       Follow Up Recommendations Home health PT    Equipment Recommendations  None recommended by PT    Recommendations for Other Services       Precautions /  Restrictions Precautions Precautions: None Restrictions Weight Bearing Restrictions: No      Mobility  Bed Mobility Overal bed mobility: Modified Independent             General bed mobility comments: additional time needed.   Transfers Overall transfer level: Needs assistance Equipment used: None Transfers: Sit to/from Stand Sit to Stand: Supervision            Ambulation/Gait Ambulation/Gait assistance: Supervision Ambulation Distance (Feet): 120 Feet Assistive device: Rolling walker (2 wheeled)   Gait velocity: 0.109m/s    General Gait Details: reports to feel week; after 162ft SpO2: 86% and HR: 115bpm  Stairs            Wheelchair Mobility    Modified Rankin (Stroke Patients Only)       Balance Overall balance assessment: Modified Independent;Mild deficits observed, not formally tested                                           Pertinent Vitals/Pain Pain Assessment: No/denies pain    Home Living Family/patient expects to be discharged to:: Private residence Living Arrangements: Other relatives(Granddaughter) Available Help at Discharge: Family Type of Home: House Home Access: Stairs to enter;Ramped entrance   Entrance Stairs-Number of Steps: 5 Home Layout: One level Home Equipment: [place] - 4 wheels;Cane - single point;Shower seat      Prior Function Level of Independence: Independent with assistive device(s);Needs assistance   Gait / Transfers Assistance Needed: tolerated very short community  distances with [facility]   ADL's / Homemaking Assistance Needed: granddaughter assists with housework, groceries, transport.         Hand Dominance        Extremity/Trunk Assessment                Communication   Communication: No difficulties  Cognition Arousal/Alertness: Awake/alert Behavior During Therapy: WFL for tasks assessed/performed Overall Cognitive Status: Within Functional Limits for tasks assessed                                         General Comments      Exercises     Assessment/Plan    PT Assessment Patient needs continued PT services  PT Problem List Decreased strength;Cardiopulmonary status limiting activity;Decreased mobility;Decreased activity tolerance       PT Treatment Interventions Balance training;DME instruction;Functional mobility training;Therapeutic activities;Therapeutic exercise;Patient/family education    PT Goals (Current goals can be found in the Care Plan section)  Acute Rehab PT Goals Patient Stated Goal: regain strength  PT Goal Formulation: With patient Time For Goal Achievement: 08/16/17 Potential to Achieve Goals: Good    Frequency Min 3X/week   Barriers to discharge        Co-evaluation               AM-PAC PT "6 Clicks" Daily Activity  Outcome Measure Difficulty turning over in bed (including adjusting bedclothes, sheets and blankets)?: A Little Difficulty moving from lying on back to sitting on the side of the bed? : A Little Difficulty sitting down on and standing up from a chair with arms (e.g., wheelchair, bedside commode, etc,.)?: A Little Help needed moving to and from a bed to chair (including a wheelchair)?: A Little Help needed walking in hospital room?: A Lot Help needed climbing 3-5 steps with a railing? : A Lot 6 Click Score: 16    End of Session Equipment Utilized During Treatment: Gait belt Activity Tolerance: Patient tolerated treatment well;Patient limited by fatigue Patient left: in chair;with nursing/sitter in room;with call bell/phone within reach Nurse Communication: Mobility status(bed is wet ) PT Visit Diagnosis: Difficulty in walking, not elsewhere classified (R26.2);Muscle weakness (generalized) (M62.81)    Time: 3202-3343 PT Time Calculation (min) (ACUTE ONLY): 25 min   Charges:   PT Evaluation $PT Eval Low Complexity: 1 Low PT Treatments $Therapeutic Activity: 8-22 mins   PT G  Codes:        2:03 PM, 08/07/17 Courtney Madden, PT, DPT Physical Therapist - [place] [phone] (Office)    Courtney Madden 08-07-2017, 1:55 PM

## 2017-08-02 NOTE — Progress Notes (Signed)
[facility] PROGRESS NOTE  Courtney Madden VPX:[id number] DOB: 23-Sep-1940 DOA: 08/01/2017 PCP: Caryl Bis, MD  Interim summary and HPI  77 y.o. female with medical history significant of anxiety, depression, facial skin cancer, colon polyps, type 2 diabetes, history of DVT,  GERD/hiatal hernia, gout, hypertension, hyperlipidemia, obesity, history of pneumonia, stage IV chronic kidney disease, history of dysphagia with esophageal endoscopic dilatation, tachycardia who is coming to the emergency room due to progressively worse shortness of breath, dry cough, weakness and fatigue for the past 2 weeks. Patient also complaining of increase frequency, nocturia and incontinence; she expressed having hx of recurrent UTI.  Assessment/Plan: 1-PNA: with concerns for atypical PNA vs aspiration (given PMH and reports of dysphagia). -will change antibiotics to zosyn and zithromax  -follow culture results -SPL consulted -start flutter valve -follow cx's results -continue PRN oxygen supplementation, Duoneb and supportive care  2-UTI -follow cx results -will cover with zosyn  -advise to keep herself well hydrated -PRN antiemetics -gentle overnight hydration  3-GERD -continue pepcid  4-HLD -continue statins   5-type 2 diabetes with nephropathy  -holding oral hypoglycemic regimen while inpatient  -will use SSI and low dose levemir QHS -Follow CBG's  6-depression/anxiety -mood stable -continue lexapro and PRN ativan  7-physical deconditioning -PT consulted and HHPT recommended -CM made aware and HH orders placed  8-CKD stage 4 -follow renal function  -Cr and GFR essentially at baseline.  9-chronic resp failure with hypoxia: due to chronic ILD -continue oxygen supplementation  -continue PRN nebulizer therapy  Code Status: Full Family Communication: no family at bedside  Disposition Plan: continue IV abx;s, follow cx's results, follow SPL rec's. Patient started on  dysphagia 3 with thin liquids while waiting on evaluation. PT has recommended HPT onc medically stable for discharge.  Consultants:  None   Procedures:  See below for x-ray reports   Antibiotics:  Rocephin 3/25>>>3/26  zithromax 3/25  Zosyn 3/26  HPI/Subjective: Feeling weak, SOB with exertion, expressing intermittent coughing spells and increase frequency. No CP, abd pain, nausea or vomiting.  Objective: Vitals:   08/02/17 1315 08/02/17 1500  BP:  117/69  Pulse: (!) 115 67  Resp:  18  Temp:  98.1 F (36.7 C)  SpO2: (!) 86% 91%    Intake/Output Summary (Last 24 hours) at 08/02/2017 1740 Last data filed at 08/02/2017 1601 Gross per 24 hour  Intake 572.5 ml  Output 100 ml  Net 472.5 ml   Filed Weights   08/01/17 1601  Weight: 73.9 kg (163 lb)    Exam:   General:  Currently afebrile, no CP, no nausea or vomiting; patient in not acute distress.  Cardiovascular: S1 and S2, no rubs, no gallops  Respiratory: positive rhonchi, no crackles, mild end exp wheezing. No using accessory muscles.  Abdomen: soft, NT, ND, positive BS  Musculoskeletal: no edema, no cyanosis, no clubbing.   Data Reviewed: Basic Metabolic Panel: Recent Labs  Lab 08/01/17 1701 08/02/17 0550  NA 137 141  K 3.8 4.4  CL 101 105  CO2 25 26  GLUCOSE 142* 83  BUN 20 19  CREATININE 2.00* 2.07*  CALCIUM 9.2 9.1   Liver Function Tests: Recent Labs  Lab 08/01/17 1701  AST 15  ALT 8*  ALKPHOS 77  BILITOT 0.5  PROT 7.7  ALBUMIN 3.1*   CBC: Recent Labs  Lab 08/01/17 1701 08/02/17 0550  WBC 10.9* 8.4  NEUTROABS 7.2 3.9  HGB 13.8 12.8  HCT 43.2 41.3  MCV 93.9 94.5  PLT  278 253   Cardiac Enzymes: Recent Labs  Lab 08/01/17 1701  TROPONINI <0.03   BNP (last 3 results) Recent Labs    08/01/17 1701  BNP 35.0   CBG: Recent Labs  Lab 08/02/17 1151  GLUCAP 150*   Studies: Dg Chest 2 View  Result Date: 08/01/2017 CLINICAL DATA:  Worsening shortness of breath. EXAM:  CHEST - 2 VIEW COMPARISON:  Chest x-ray dated March 18, 2017. FINDINGS: The heart size and mediastinal contours are within normal limits. New diffuse fine reticular interstitial thickening throughout both lungs. Right basilar atelectasis. No consolidation, pleural effusion, or pneumothorax. No acute osseous abnormality. IMPRESSION: 1. New diffuse fine reticular interstitial thickening throughout both lungs, suspicious for interstitial edema or interstitial pneumonitis related to an infectious or inflammatory etiology. Electronically Signed   By: Titus Dubin M.D.   On: 08/01/2017 16:49    Scheduled Meds: . allopurinol  100 mg Oral Daily  . aspirin EC  81 mg Oral Daily  . bethanechol  5 mg Oral Daily  . enoxaparin (LOVENOX) injection  30 mg Subcutaneous Q24H  . escitalopram  10 mg Oral Daily  . famotidine  20 mg Oral Daily  . [START ON 08/03/2017] insulin aspart  0-15 Units Subcutaneous TID WC  . insulin detemir  5 Units Subcutaneous QHS  . ipratropium-albuterol  3 mL Nebulization TID  . mouth rinse  15 mL Mouth Rinse BID  . simvastatin  40 mg Oral QPM   Continuous Infusions: . sodium chloride Stopped (08/02/17 1601)  . azithromycin Stopped (08/02/17 0310)  . cefTRIAXone (ROCEPHIN)  IV Stopped (08/02/17 0051)     Time spent: 30 minutes    [facility] Pager [phone] If 7PM-7AM, please contact night-coverage at [email or web address], password [facility] 08/02/2017, 5:40 PM  LOS: 1 day

## 2017-08-03 DIAGNOSIS — J690 Pneumonitis due to inhalation of food and vomit: Secondary | ICD-10-CM

## 2017-08-03 DIAGNOSIS — F418 Other specified anxiety disorders: Secondary | ICD-10-CM

## 2017-08-03 DIAGNOSIS — J189 Pneumonia, unspecified organism: Secondary | ICD-10-CM

## 2017-08-03 DIAGNOSIS — E782 Mixed hyperlipidemia: Secondary | ICD-10-CM

## 2017-08-03 DIAGNOSIS — N390 Urinary tract infection, site not specified: Secondary | ICD-10-CM

## 2017-08-03 DIAGNOSIS — E1165 Type 2 diabetes mellitus with hyperglycemia: Secondary | ICD-10-CM

## 2017-08-03 DIAGNOSIS — N184 Chronic kidney disease, stage 4 (severe): Secondary | ICD-10-CM

## 2017-08-03 DIAGNOSIS — E440 Moderate protein-calorie malnutrition: Secondary | ICD-10-CM

## 2017-08-03 LAB — URINE CULTURE

## 2017-08-03 LAB — CBC WITH DIFFERENTIAL/PLATELET
Basophils Absolute: 0 10*3/uL (ref 0.0–0.1)
Basophils Relative: 0 %
EOS ABS: 0.4 10*3/uL (ref 0.0–0.7)
EOS PCT: 5 %
HCT: 39.7 % (ref 36.0–46.0)
Hemoglobin: 12.4 g/dL (ref 12.0–15.0)
LYMPHS ABS: 2.6 10*3/uL (ref 0.7–4.0)
Lymphocytes Relative: 30 %
MCH: 29.6 pg (ref 26.0–34.0)
MCHC: 31.2 g/dL (ref 30.0–36.0)
MCV: 94.7 fL (ref 78.0–100.0)
Monocytes Absolute: 0.7 10*3/uL (ref 0.1–1.0)
Monocytes Relative: 8 %
Neutro Abs: 4.8 10*3/uL (ref 1.7–7.7)
Neutrophils Relative %: 57 %
PLATELETS: 246 10*3/uL (ref 150–400)
RBC: 4.19 MIL/uL (ref 3.87–5.11)
RDW: 15.4 % (ref 11.5–15.5)
WBC: 8.5 10*3/uL (ref 4.0–10.5)

## 2017-08-03 LAB — GLUCOSE, CAPILLARY
GLUCOSE-CAPILLARY: 105 mg/dL — AB (ref 65–99)
GLUCOSE-CAPILLARY: 150 mg/dL — AB (ref 65–99)
Glucose-Capillary: 212 mg/dL — ABNORMAL HIGH (ref 65–99)
Glucose-Capillary: 92 mg/dL (ref 65–99)

## 2017-08-03 LAB — BASIC METABOLIC PANEL
Anion gap: 10 (ref 5–15)
BUN: 21 mg/dL — AB (ref 6–20)
CALCIUM: 8.9 mg/dL (ref 8.9–10.3)
CO2: 25 mmol/L (ref 22–32)
CREATININE: 2.08 mg/dL — AB (ref 0.44–1.00)
Chloride: 106 mmol/L (ref 101–111)
GFR calc Af Amer: 25 mL/min — ABNORMAL LOW (ref >60)
GFR, EST NON AFRICAN AMERICAN: 22 mL/min — AB (ref >60)
Glucose, Bld: 74 mg/dL (ref 65–99)
POTASSIUM: 4 mmol/L (ref 3.5–5.1)
SODIUM: 141 mmol/L (ref 135–145)

## 2017-08-03 MED ORDER — DOCUSATE SODIUM 100 MG PO CAPS: 200.0000 mg | ORAL_CAPSULE | Freq: Every day | ORAL | Status: DC

## 2017-08-03 MED ORDER — ALUM & MAG HYDROXIDE-SIMETH 200-200-20 MG/5ML PO SUSP
30.0000 mL | ORAL | Status: DC | PRN
  Administered 2017-08-03: 30 mL via ORAL
  Filled 2017-08-03: qty 30

## 2017-08-03 MED ORDER — POLYETHYLENE GLYCOL 3350 17 G PO PACK
17.0000 g | PACK | Freq: Every day | ORAL | Status: DC | PRN
  Administered 2017-08-03 – 2017-08-04 (×2): 17 g via ORAL
  Filled 2017-08-03 (×2): qty 1

## 2017-08-03 MED ORDER — GLUCERNA SHAKE PO LIQD
237.0000 mL | Freq: Three times a day (TID) | ORAL | Status: DC
  Administered 2017-08-03 – 2017-08-05 (×5): 237 mL via ORAL

## 2017-08-03 MED ORDER — IPRATROPIUM-ALBUTEROL 0.5-2.5 (3) MG/3ML IN SOLN
3.0000 mL | Freq: Two times a day (BID) | RESPIRATORY_TRACT | Status: DC
  Administered 2017-08-03 – 2017-08-05 (×3): 3 mL via RESPIRATORY_TRACT
  Filled 2017-08-03 (×5): qty 3

## 2017-08-03 MED ORDER — NYSTATIN 100000 UNIT/GM EX POWD
Freq: Two times a day (BID) | CUTANEOUS | Status: DC
  Administered 2017-08-03 – 2017-08-05 (×4): via TOPICAL
  Filled 2017-08-03: qty 15

## 2017-08-03 NOTE — Progress Notes (Signed)
Initial Nutrition Assessment  DOCUMENTATION CODES:   Non-severe (moderate) malnutrition in context of acute illness/injury  INTERVENTION:   - Glucerna Shake po TID, each supplement provides 220 kcal and 10 grams of protein  - Encourage PO intake  NUTRITION DIAGNOSIS:   Moderate Malnutrition related to acute illness(pneumonia) as evidenced by energy intake < 75% for > 7 days, mild muscle depletion.  GOAL:   Patient will meet greater than or equal to 90% of their needs  MONITOR:   PO intake, Supplement acceptance, Labs, Weight trends, I & O's  REASON FOR ASSESSMENT:   Malnutrition Screening Tool    ASSESSMENT:   77 year old female who presented to ED with SOB, generalized weakness, and dry cough x 2 weeks. Pt with PMH significant for anxiety, arthritis, skin cancer, colon polyps, depression, diabetes mellitus type 2, DVT, GERD, hiatal hernia, gout, HTN, HLD, dysphagia with esophageal endoscopic dilatation, CKD stage 4. Pt admitted for treatment of pneumonia and UTI.  Spoke with pt at bedside. Pt states she has had a poor appetite for the past 3 weeks due to feeling sick and weak. Pt reports eating 0-1 meal daily. A typical day of eating over the past 3 weeks might include cereal and ice cream. Pt states that she did eat breakfast this morning and has eggs, sausage, and a biscuit with jelly. No meal tray in room at time of visit, and no meal completion listed in chart for this morning.  Pt states she has "lost 20 lbs." Pt reports her UBW as 180-185 lbs and that she last weighed this in December 2018. Unable to confirm with weight history in chart as pt's most recent weight prior to current hospitalization is from December 2015. Unsure of accuracy of weight recorded on 08/01/17 as it appears to be a reported weight rather than a measured weight.  Pt endorses difficulty swallowing and states she doesn't swallow "pills or meats." Pt states she drinks water at home. Pt agreeable to  receiving Glucerna oral nutrition supplement during hospital stay.  Meal Completion: 25%  Medications reviewed and include: 20 mg Pepcid daily, sliding scale Novolog, 5 units Levemir daily, IV antibiotics  Labs reviewed: BUN 21, creatinine 2.08, hemoglobin A1C 8.7 CBGs: 105, 136, 136, 150  NUTRITION - FOCUSED PHYSICAL EXAM:    Most Recent Value  Orbital Region  Mild depletion  Upper Arm Region  No depletion  Thoracic and Lumbar Region  No depletion  Buccal Region  No depletion  Temple Region  Mild depletion  Clavicle Bone Region  No depletion  Clavicle and Acromion Bone Region  Mild depletion  Scapular Bone Region  Unable to assess  Dorsal Hand  No depletion  Patellar Region  Mild depletion  Anterior Thigh Region  No depletion  Posterior Calf Region  Mild depletion  Edema (RD Assessment)  None  Hair  Reviewed  Eyes  Reviewed  Mouth  Reviewed  Skin  Reviewed  Nails  Reviewed       Diet Order:  DIET DYS 3 Room service appropriate? Yes; Fluid consistency: Thin  EDUCATION NEEDS:   No education needs have been identified at this time  Skin:  Skin Assessment: Reviewed RN Assessment(redness to sacrum, MASD to abdomen and breast)  Last BM:  unknown  Height:   Ht Readings from Last 1 Encounters:  08/01/17 5\' 5"  (1.651 m)    Weight:   Wt Readings from Last 1 Encounters:  08/01/17 163 lb (73.9 kg)    Ideal Body Weight:  56.8  kg  BMI:  Body mass index is 27.12 kg/m.  Estimated Nutritional Needs:   Kcal:  1600-1800 kcal/day  Protein:  80-90 grams/day  Fluid:  1.6-1.8 L/day    Gaynell Face, MS, RD, LDN Pager: [phone] Weekend/After Hours: [phone]

## 2017-08-03 NOTE — Progress Notes (Signed)
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  The patient saw her primary care provider 2 weeks ago for similar symptoms and symptomatic therapy was recommended.  Her symptoms continue to progress.  She went back to see her primary care provider on the morning of 08/01/2017, and the patient was recommended to go to emergency department for further evaluation.  Chest x-ray showed diffuse reticular interstitial opacities bilateral.  The patient was started on Zosyn and azithromycin.  She also notes some difficulty swallowing pills and solid foods recently.  Assessment/Plan: Pneumonia -atypical vs aspiration -appreciate speech therapy eval-->Dys 3 with thin -encouraged pt to have MBS done to look for zenker's -continue zosyn and azithro -continue bronchodilators  UTI -3/25 UA--TNTC WBC -urine culture with GBS  Diabetes mellitus type 2, uncontrolled with nephropathy -08/02/2017 hemoglobin A1c 8.7 -Continue Levemir and NovoLog sliding scale while in the hospital -Holding oral hypoglycemic agents while in the hospital  Hyperlipidemia -Continue statin  CKD stage IV -Baseline creatinine 1.6-2.0 -A.m. BMP  Physical deconditioning -PT evaluation--> home health PT  Depression/anxiety -Continue Lexapro   Disposition Plan:   Home in 1-2 days  Family Communication:  No Family at bedside  Consultants: none   Code Status:  FULL  DVT Prophylaxis: [place] Lovenox   Procedures: As Listed in Progress Note Above  Antibiotics: Zosyn 3/26>>> azithro 3/25>>>    Subjective: Patient continues to complain of chronic cough.  She states her breathing is little bit better.   She still has some dyspnea on exertion.  She denies any nausea, vomiting, diarrhea, abdominal pain.  There is no dysuria or hematuria.  She denies any headache or neck pain.  There is no fevers or chills.  Objective: Vitals:   08/02/17 2051 08/03/17 0549 08/03/17 0748 08/03/17 1500  BP:  114/65    Pulse:  77  77  Resp:    20  Temp:  98.4 F (36.9 C)  98.7 F (37.1 C)  TempSrc:  Oral  Oral  SpO2: 97% 96% 92% 97%  Weight:      Height:        Intake/Output Summary (Last 24 hours) at 08/03/2017 1534 Last data filed at 08/03/2017 0400 Gross per 24 hour  Intake 762.5 ml  Output -  Net 762.5 ml   Weight change:  Exam:   General:  Pt is alert, follows commands appropriately, not in acute distress  HEENT: No icterus, No thrush, No neck mass, [place]/AT  Cardiovascular: RRR, S1/S2, no rubs, no gallops  Respiratory: Bilateral crackles but no wheezing.  Good air movement.  Abdomen: Soft/+BS, non tender, non distended, no guarding  Extremities: trace LE edema, No lymphangitis, No petechiae, No rashes, no synovitis   Data Reviewed: I have personally reviewed following labs and imaging studies Basic Metabolic Panel: Recent Labs  Lab 08/01/17 1701 08/02/17 0550 08/03/17 0536  NA 137 141 141  K 3.8 4.4 4.0  CL 101 105 106  CO2 25 26 25   GLUCOSE 142* 83 74  BUN 20 19 21*  CREATININE 2.00* 2.07* 2.08*  CALCIUM 9.2 9.1 8.9   Liver Function Tests: Recent Labs  Lab 08/01/17 1701  AST 15  ALT 8*  ALKPHOS 77  BILITOT 0.5  PROT 7.7  ALBUMIN 3.1*   No results for input(s): LIPASE, AMYLASE in the last 168 hours. No results for input(s): AMMONIA in the last 168 hours. Coagulation Profile: No results for input(s): INR, PROTIME in the last 168 hours. CBC: Recent Labs  Lab 08/01/17 1701 08/02/17 0550 08/03/17 0536  WBC 10.9* 8.4 8.5  NEUTROABS 7.2 3.9 4.8  HGB 13.8 12.8 12.4  HCT 43.2 41.3 39.7  MCV 93.9 94.5 94.7  PLT 278 253 246   Cardiac Enzymes: Recent Labs  Lab  08/01/17 1701  TROPONINI <0.03   BNP: Invalid input(s): POCBNP CBG: Recent Labs  Lab 08/02/17 1151 08/02/17 1825 08/02/17 2150 08/03/17 0756 08/03/17 1111  GLUCAP 150* 136* 136* 105* 212*   HbA1C: Recent Labs    08/02/17 1742  HGBA1C 8.7*   Urine analysis:    Component Value Date/Time   COLORURINE YELLOW 08/01/2017 1755   APPEARANCEUR TURBID (A) 08/01/2017 1755   LABSPEC 1.014 08/01/2017 1755   PHURINE 5.0 08/01/2017 1755   GLUCOSEU NEGATIVE 08/01/2017 1755   HGBUR SMALL (A) 08/01/2017 1755   BILIRUBINUR NEGATIVE 08/01/2017 [place] 08/01/2017 1755   PROTEINUR 100 (A) 08/01/2017 1755   NITRITE NEGATIVE 08/01/2017 1755   LEUKOCYTESUR MODERATE (A) 08/01/2017 1755   Sepsis Labs: @LABRCNTIP (procalcitonin:4,lacticidven:4) ) Recent Results (from the past 240 hour(s))  Urine Culture     Status: Abnormal   Collection Time: 08/01/17  9:08 PM  Result Value Ref Range Status   Specimen Description   Final    URINE, CLEAN CATCH Performed at [facility], [address] [place], [place] [address]    Special Requests   Final    NONE Performed at [facility], [address] [place], [place] [address]    Culture (A)  Final    >=100,000 COLONIES/mL GROUP B STREP(S.AGALACTIAE)ISOLATED TESTING AGAINST S. AGALACTIAE NOT ROUTINELY PERFORMED DUE TO PREDICTABILITY OF AMP/PEN/VAN SUSCEPTIBILITY. Performed at [facility], [place] [address], [place], [place] [address]    Report Status 08/03/2017 FINAL  Final     Scheduled Meds: . allopurinol  100 mg Oral Daily  . aspirin EC  81 mg Oral Daily  . bethanechol  5 mg Oral Daily  . enoxaparin (LOVENOX) injection  30 mg Subcutaneous Q24H  . escitalopram  10 mg Oral Daily  . famotidine  20 mg Oral Daily  . feeding supplement (GLUCERNA SHAKE)  237 mL Oral TID BM  . insulin aspart  0-15 Units Subcutaneous TID WC  . insulin detemir  5 Units Subcutaneous QHS  . ipratropium-albuterol  3 mL Nebulization BID  .  mouth rinse  15 mL Mouth Rinse BID  . nystatin   Topical BID  . simvastatin  40 mg Oral QPM   Continuous Infusions: . azithromycin Stopped (08/02/17 2329)  . piperacillin-tazobactam (ZOSYN)  IV Stopped (08/03/17 9381)    Procedures/Studies: Dg Chest 2 View  Result Date: 08/01/2017 CLINICAL DATA:  Worsening shortness of breath. EXAM: CHEST - 2 VIEW COMPARISON:  Chest x-ray dated March 18, 2017. FINDINGS: The heart size and mediastinal contours are within normal limits. New diffuse fine reticular interstitial thickening throughout both lungs. Right basilar atelectasis. No consolidation, pleural effusion, or pneumothorax. No acute osseous abnormality. IMPRESSION: 1. New diffuse fine reticular interstitial thickening throughout both lungs, suspicious for interstitial edema or interstitial pneumonitis related to an infectious or inflammatory etiology. Electronically Signed   By: Titus Dubin M.D.   On: 08/01/2017 16:49    Courtney Madden  Courtney Tirone, DO  [facility] Pager [phone]  If 7PM-7AM, please contact night-coverage [email or web address] Password [facility] 08/03/2017, 3:34 PM   LOS: 2 days

## 2017-08-03 NOTE — Progress Notes (Signed)
Physical Therapy Treatment Patient Details Name: Courtney Madden MRN: [id number] DOB: [birth year] Today's Date: 08/03/2017    History of Present Illness Courtney Madden is a 77yo white female who comes to [facility] on 3/25 after SOB, dry cough, fatigue, and weakness x 2 weeks: pt admitted with PNA and UTI. At baseline pt AMB mostly household distances, but out to church 1x weekly with SPC. Granddaughter assist with dressign bathing when ill or weak. Granddaughter assists with IADL and househwork. No falls recently.     PT Comments    Pt admitted with above diagnosis. Pt currently with functional limitations due to the deficits listed below (see PT Problem List). Pt will benefit from continued skilled PT to increase their independence and safety with mobility to allow discharge to the venue listed below. Patient is making good progress with PT. From a mobility standpoint patient will be ready for DC home with HHPT. Patient has a RW at home. Discussed using RW for ambulation outside her home for additional endurance and stability training.     Follow Up Recommendations  Home health PT     Equipment Recommendations  None recommended by PT    Recommendations for Other Services       Precautions / Restrictions Precautions Precautions: Fall Restrictions Weight Bearing Restrictions: No    Mobility  Bed Mobility               General bed mobility comments: patient in recliner when therapist arrived and left.  Transfers Overall transfer level: Needs assistance Equipment used: None Transfers: Sit to/from Stand Sit to Stand: Supervision            Ambulation/Gait Ambulation/Gait assistance: Min guard Ambulation Distance (Feet): 400 Feet Assistive device: None       General Gait Details: SOB after 400 ft ambulation without AD   Stairs            Wheelchair Mobility    Modified Rankin (Stroke Patients Only)       Balance Overall balance assessment: Modified  Independent;Mild deficits observed, not formally tested                                          Cognition Arousal/Alertness: Awake/alert Behavior During Therapy: WFL for tasks assessed/performed Overall Cognitive Status: Within Functional Limits for tasks assessed                                        Exercises General Exercises - Lower Extremity Gluteal Sets: Seated;Strengthening;Both;10 reps Long Arc Quad: Seated;Strengthening;Both;10 reps Hip Flexion/Marching: Seated;Strengthening;Both;10 reps Toe Raises: Seated;Strengthening;Both;10 reps Heel Raises: Seated;Strengthening;Both;10 reps    General Comments        Pertinent Vitals/Pain Pain Assessment: No/denies pain    Home Living                      Prior Function            PT Goals (current goals can now be found in the care plan section)      Frequency    Min 3X/week      PT Plan Current plan remains appropriate    Co-evaluation              AM-PAC PT "6 Clicks" Daily Activity  Outcome Measure  End of Session Equipment Utilized During Treatment: Gait belt Activity Tolerance: Patient tolerated treatment well;Patient limited by fatigue Patient left: in chair;with call bell/phone within reach Nurse Communication: Mobility status PT Visit Diagnosis: Difficulty in walking, not elsewhere classified (R26.2);Muscle weakness (generalized) (M62.81)     Time: 2536-6440 PT Time Calculation (min) (ACUTE ONLY): 29 min  Charges:  $Gait Training: 8-22 mins $Therapeutic Activity: 8-22 mins                    G Codes:       Marck Mcclenny D. Hartnett-Rands, MS, PT Per [place] [phone] 08/03/2017, 11:33 AM

## 2017-08-03 NOTE — Evaluation (Signed)
Clinical/Bedside Swallow Evaluation Patient Details  Name: Courtney Madden MRN: [id number] Date of Birth: 10/24/1940  Today's Date: 08/03/2017 Time: SLP Start Time (ACUTE ONLY): 1249 SLP Stop Time (ACUTE ONLY): 1316 SLP Time Calculation (min) (ACUTE ONLY): 27 min  Past Medical History:  Past Medical History:  Diagnosis Date  . Anxiety   . Arthritis   . Cancer ([place])    skin cancer- face  . Colon polyps   . Depression   . Diabetes ([place])   . DVT (deep venous thrombosis) ([place])   . Dysrhythmia   . GERD (gastroesophageal reflux disease)   . Gout   . Hiatal hernia    patient unaware  . HTN (hypertension)   . Hyperlipidemia   . Obesity   . Pneumonia   . Shortness of breath dyspnea    currently- 04/2014  . Stage 4 chronic kidney disease ([place])   . Status post dilation of esophageal narrowing   . Swallowing difficulty   . Tachycardia    Past Surgical History:  Past Surgical History:  Procedure Laterality Date  . APPENDECTOMY    . BREAST CYST EXCISION Left   . ESOPHAGOSCOPY N/A 04/19/2014   Procedure: ESOPHAGOSCOPY;  Surgeon: [name], MD;  Location: [facility];  Service: ENT;  Laterality: N/A;  . EYE SURGERY Bilateral    Cataract withe implants  . Ovarian Mass Excision    . SKIN CANCER EXCISION    . TOTAL ABDOMINAL HYSTERECTOMY    . ZENKER'S DIVERTICULECTOMY N/A 04/19/2014   Procedure: ENDOSCOPY REPAIR ZENKER'S DIVERTICULECTOMY;  Surgeon: [name], MD;  Location: [facility] OR;  Service: ENT;  Laterality: N/A;   HPI:  Courtney Madden is a 77 y.o. female with medical history significant of anxiety, depression, facial skin cancer, colon polyps, type 2 diabetes, history of DVT,  GERD/hiatal hernia, gout, hypertension, hyperlipidemia, obesity, history of pneumonia, stage IV chronic kidney disease, history of dysphagia with esophageal endoscopic dilatation, tachycardia who is coming to the emergency room due to progressively worse shortness of breath, dry cough, weakness and  fatigue for the past 2 weeks.  She tried to see her PCP earlier last week, but states that her PCP was away and she was unable to see him.  Finally today, her PCP evaluated her and try to direct admit her to [facility], but then referred her to the ED at [facility], since there were no beds available for the direct admission.  She denies fever, but complains of chills.  No sore throat or rhinorrhea.  No hemoptysis, chest pain, dizziness, diaphoresis, PND, orthopnea or recent pitting edema of the lower extremities.  She complains of frequent palpitations.  No abdominal pain, nausea, vomiting, diarrhea, constipation, melena or hematochezia.  Denies dysuria or hematuria, but complains of nocturia and frequency.  No heat or cold intolerance.  Denies skin rashes or pruritus. Pt had surgery with Dr. Erik Obey 81/1914 to repair a large Zenker's diverticulum.   Assessment / Plan / Recommendation Clinical Impression  Pt does not exhibit overt signs or symptoms of aspiration clinically, however she does have to compensate for premorbid swallowing difficulties. Pt tells SLP that she choked on a fireball when she was 77 years old and has always been nervous when eating. 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 She had it repaired 78/2956 with Dr. Polly Cobia. Since that time, she is careful when she eats, avoiding meats unless chopped and crushing pills in puree. Pt sleeps in a recliner at home  due to reflux. Recommend dysphagia 3/mech soft and thin liquids with self regulated implementation of compensatory strategies. If recurrence of Zenker's is suspected (Pt with "atypical pneumonia"), consider objective imaging via barium swallow or MBSS. Both were offered to Pt and she stated, "I'll think about it."  SLP Visit Diagnosis: Dysphagia, unspecified (R13.10)    Aspiration Risk  Mild aspiration risk    Diet Recommendation Dysphagia 3 (Mech soft);Thin liquid   Liquid Administration via:  Cup;Straw Medication Administration: Crushed with puree Supervision: Patient able to self feed Compensations: Slow rate;Small sips/bites Postural Changes: Seated upright at 90 degrees;Remain upright for at least 30 minutes after po intake    Other  Recommendations Oral Care Recommendations: Oral care BID;Patient independent with oral care Other Recommendations: Clarify dietary restrictions   Follow up Recommendations None      Frequency and Duration min 2x/week  1 week       Prognosis Prognosis for Safe Diet Advancement: Fair Barriers to Reach Goals: Other (Comment)(h/o esophageal dysphagia)      Swallow Study   General Date of Onset: 08/02/17 HPI: Courtney Madden is a 77 y.o. female with medical history significant of anxiety, depression, facial skin cancer, colon polyps, type 2 diabetes, history of DVT,  GERD/hiatal hernia, gout, hypertension, hyperlipidemia, obesity, history of pneumonia, stage IV chronic kidney disease, history of dysphagia with esophageal endoscopic dilatation, tachycardia who is coming to the emergency room due to progressively worse shortness of breath, dry cough, weakness and fatigue for the past 2 weeks.  She tried to see her PCP earlier last week, but states that her PCP was away and she was unable to see him.  Finally today, her PCP evaluated her and try to direct admit her to [facility], but then referred her to the ED at [facility], since there were no beds available for the direct admission.  She denies fever, but complains of chills.  No sore throat or rhinorrhea.  No hemoptysis, chest pain, dizziness, diaphoresis, PND, orthopnea or recent pitting edema of the lower extremities.  She complains of frequent palpitations.  No abdominal pain, nausea, vomiting, diarrhea, constipation, melena or hematochezia.  Denies dysuria or hematuria, but complains of nocturia and frequency.  No heat or cold intolerance.  Denies skin rashes or pruritus. Pt had surgery with Dr. Erik Obey  78/2956 to repair a large Zenker's diverticulum. Type of Study: Bedside Swallow Evaluation Previous Swallow Assessment: Pt had Zenker's repair in 2015 Diet Prior to this Study: Dysphagia 3 (soft);Thin liquids Temperature Spikes Noted: No Respiratory Status: Nasal cannula History of Recent Intubation: No Behavior/Cognition: Alert;Cooperative;Pleasant mood Oral Cavity Assessment: Within Functional Limits Oral Care Completed by SLP: No Oral Cavity - Dentition: Dentures, top;Edentulous Vision: Functional for self-feeding Self-Feeding Abilities: Able to feed self Patient Positioning: Upright in chair Baseline Vocal Quality: Normal Volitional Cough: Strong Volitional Swallow: Able to elicit    Oral/Motor/Sensory Function Overall Oral Motor/Sensory Function: Within functional limits   Ice Chips Ice chips: Within functional limits Presentation: Spoon   Thin Liquid Thin Liquid: Within functional limits Presentation: Cup;Self Fed;Straw Other Comments: Pt with mild oral holding, which she reports is baseline    Nectar Thick Nectar Thick Liquid: Not tested   Honey Thick Honey Thick Liquid: Not tested   Puree Puree: Within functional limits Presentation: Spoon   Solid   Thank you,  Genene Churn, CCC-SLP [phone]    Solid: Within functional limits Presentation: [place] 08/03/2017,1:27 PM

## 2017-08-04 ENCOUNTER — Inpatient Hospital Stay (HOSPITAL_COMMUNITY): Payer: Medicare Other

## 2017-08-04 DIAGNOSIS — I10 Essential (primary) hypertension: Secondary | ICD-10-CM

## 2017-08-04 DIAGNOSIS — J690 Pneumonitis due to inhalation of food and vomit: Principal | ICD-10-CM

## 2017-08-04 DIAGNOSIS — E440 Moderate protein-calorie malnutrition: Secondary | ICD-10-CM

## 2017-08-04 LAB — CBC WITH DIFFERENTIAL/PLATELET
BASOS PCT: 0 %
Basophils Absolute: 0 10*3/uL (ref 0.0–0.1)
EOS PCT: 7 %
Eosinophils Absolute: 0.6 10*3/uL (ref 0.0–0.7)
HEMATOCRIT: 37.5 % (ref 36.0–46.0)
Hemoglobin: 11.7 g/dL — ABNORMAL LOW (ref 12.0–15.0)
Lymphocytes Relative: 30 %
Lymphs Abs: 2.9 10*3/uL (ref 0.7–4.0)
MCH: 29.5 pg (ref 26.0–34.0)
MCHC: 31.2 g/dL (ref 30.0–36.0)
MCV: 94.5 fL (ref 78.0–100.0)
MONO ABS: 0.7 10*3/uL (ref 0.1–1.0)
MONOS PCT: 7 %
NEUTROS ABS: 5.3 10*3/uL (ref 1.7–7.7)
Neutrophils Relative %: 56 %
PLATELETS: 275 10*3/uL (ref 150–400)
RBC: 3.97 MIL/uL (ref 3.87–5.11)
RDW: 15.5 % (ref 11.5–15.5)
WBC: 9.5 10*3/uL (ref 4.0–10.5)

## 2017-08-04 LAB — GLUCOSE, CAPILLARY
GLUCOSE-CAPILLARY: 149 mg/dL — AB (ref 65–99)
Glucose-Capillary: 112 mg/dL — ABNORMAL HIGH (ref 65–99)
Glucose-Capillary: 153 mg/dL — ABNORMAL HIGH (ref 65–99)
Glucose-Capillary: 168 mg/dL — ABNORMAL HIGH (ref 65–99)
Glucose-Capillary: 61 mg/dL — ABNORMAL LOW (ref 65–99)

## 2017-08-04 LAB — LEGIONELLA PNEUMOPHILA SEROGP 1 UR AG: L. PNEUMOPHILA SEROGP 1 UR AG: NEGATIVE

## 2017-08-04 LAB — PROCALCITONIN

## 2017-08-04 LAB — STREP PNEUMONIAE URINARY ANTIGEN: STREP PNEUMO URINARY ANTIGEN: NEGATIVE

## 2017-08-04 MED ORDER — POTASSIUM CHLORIDE IN NACL 20-0.9 MEQ/L-% IV SOLN
INTRAVENOUS | Status: DC
  Administered 2017-08-04: 19:00:00 via INTRAVENOUS

## 2017-08-04 MED ORDER — SODIUM CHLORIDE 0.9 % IV SOLN
3.0000 g | Freq: Two times a day (BID) | INTRAVENOUS | Status: DC
  Administered 2017-08-04 – 2017-08-05 (×2): 3 g via INTRAVENOUS
  Filled 2017-08-04 (×8): qty 3

## 2017-08-04 NOTE — Progress Notes (Signed)
Modified Barium Swallow Progress Note  Patient Details  Name: Courtney Madden MRN: [id number] Date of Birth: [birth year]  Today's Date: 08/04/2017  Modified Barium Swallow completed.  Full report located under Chart Review in the Imaging Section.  Brief recommendations include the following:  Clinical Impression  Pt presents with pharyngoesophageal phase dysphagia characterized by liquids and puree entering large outpouching (confirmed Zenker's diverticulum by radiologist). No backflow into pharynx observed, however Pt is at risk for backflow and aspiration. Pt reported that she was completing self care tasks in the bathroom before the MBSS when she started coughing and "vomited". SLP reviewed imaging with Pt and advised that Pt be mindful about bending over after po intake due to risk of regurgitation/backflow. Pt reportedly had her Zenker's repaired by Dr. Erik Obey in 8242. She expresses concern about undergoing a procedure to repair it now. Pt plans to discuss options with her PCP and ENT. Recommend D3/mech soft and thin liquids with aspiration and reflux precautions, crush meds as able in puree; f/u with ENT. I called Pt's granddaughter and relayed all information to her and provided my contact information should they have further questions.    Swallow Evaluation Recommendations   Recommended Consults: Consider ENT evaluation(f/u with ENT for possible Zenker's repair)   SLP Diet Recommendations: Dysphagia 3 (Mech soft) solids;Thin liquid   Liquid Administration via: Cup;Straw   Medication Administration: Crushed with puree   Supervision: Patient able to self feed       Postural Changes: Remain semi-upright after after feeds/meals (Comment);Seated upright at 90 degrees   Oral Care Recommendations: Oral care BID;Patient independent with oral care   Other Recommendations: Clarify dietary restrictions   Thank you,  Genene Churn,  [place] 08/04/2017,4:58 PM

## 2017-08-04 NOTE — Progress Notes (Signed)
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  The patient saw her primary care provider 2 weeks ago for similar symptoms and symptomatic therapy was recommended.  Her symptoms continue to progress.  She went back to see her primary care provider on the morning of 08/01/2017, and the patient was recommended to go to emergency department for further evaluation.  Chest x-ray showed diffuse reticular interstitial opacities bilateral.  The patient was started on Zosyn and azithromycin initially.  She also notes some difficulty swallowing pills and solid foods in past few weeks.  Speech therapy was consulted to assist with management.  Assessment/Plan: Aspiration Pneumonia -atypical vs aspiration -urine legionella neg -appreciate speech therapy eval-->Dys 3 with thin -MBS confirmed large zenker's diverticululm which is the likely etiology of aspiration/dysphagia -continue azithro -switch zosyn to unasyn -continue bronchodilators  UTI -3/25 UA--TNTC WBC -urine culture with GBS  Diabetes mellitus type 2, uncontrolled with nephropathy -08/02/2017 hemoglobin A1c 8.7 -Continue Levemir and NovoLog sliding scale while in the hospital -Holding oral hypoglycemic agents while in the hospital  Hyperlipidemia -Continue statin  CKD stage IV -Baseline creatinine 1.6-2.0 -A.m. BMP  Physical deconditioning -PT evaluation--> home health PT  Depression/anxiety -Continue Lexapro  Moderate malnutrition -continue glucerna   Disposition Plan:   Home 3/29 if stable Family Communication:  No Family at bedside  Consultants: none   Code Status:  FULL  DVT  Prophylaxis: [place] Lovenox   Procedures: As Listed in Progress Note Above  Antibiotics: Zosyn 3/26>>> azithro 3/25>>>     Subjective: Patient had an episode of coughing resulting in vomiting while in the bathroom.  She denied any worsening chest pain, shortness breath, diarrhea, abdominal pain.  She states overall her breathing is better. Objective: Vitals:   08/03/17 2100 08/04/17 0500 08/04/17 0815 08/04/17 1300  BP: 110/65 115/75  126/74  Pulse: 75 70  69  Resp: 20 20  18   Temp: 98 F (36.7 C) 98.2 F (36.8 C)  98.3 F (36.8 C)  TempSrc: Oral Oral  Oral  SpO2: 95% 98% 98% 99%  Weight:      Height:       No intake or output data in the 24 hours ending 08/04/17 1720 Weight change:  Exam:   General:  Pt is alert, follows commands appropriately, not in acute distress  HEENT: No icterus, No thrush, No neck mass, [place]/AT  Cardiovascular: RRR, S1/S2, no rubs, no gallops  Respiratory: Scattered bilateral crackles.  No wheezing.  Good air movement.  Abdomen: Soft/+BS, non tender, non distended, no guarding  Extremities: No edema, No lymphangitis, No petechiae, No rashes, no synovitis   Data Reviewed: I have personally reviewed following labs and imaging studies Basic Metabolic Panel: Recent Labs  Lab 08/01/17 1701 08/02/17 0550 08/03/17 0536  NA 137 141 141  K 3.8 4.4 4.0  CL 101 105 106  CO2 25 26 25   GLUCOSE 142* 83 74  BUN 20 19 21*  CREATININE 2.00* 2.07* 2.08*  CALCIUM 9.2 9.1 8.9   Liver Function Tests: Recent Labs  Lab 08/01/17 1701  AST 15  ALT 8*  ALKPHOS 77  BILITOT 0.5  PROT 7.7  ALBUMIN 3.1*   No results for  input(s): LIPASE, AMYLASE in the last 168 hours. No results for input(s): AMMONIA in the last 168 hours. Coagulation Profile: No results for input(s): INR, PROTIME in the last 168 hours. CBC: Recent Labs  Lab 08/01/17 1701 08/02/17 0550 08/03/17 0536 08/04/17 0631  WBC 10.9* 8.4 8.5 9.5  NEUTROABS 7.2 3.9 4.8 5.3  HGB  13.8 12.8 12.4 11.7*  HCT 43.2 41.3 39.7 37.5  MCV 93.9 94.5 94.7 94.5  PLT 278 253 246 275   Cardiac Enzymes: Recent Labs  Lab 08/01/17 1701  TROPONINI <0.03   BNP: Invalid input(s): POCBNP CBG: Recent Labs  Lab 08/03/17 1645 08/03/17 2105 08/04/17 0854 08/04/17 1212 08/04/17 1623  GLUCAP 92 150* 112* 168* 149*   HbA1C: Recent Labs    08/02/17 1742  HGBA1C 8.7*   Urine analysis:    Component Value Date/Time   COLORURINE YELLOW 08/01/2017 1755   APPEARANCEUR TURBID (A) 08/01/2017 1755   LABSPEC 1.014 08/01/2017 1755   PHURINE 5.0 08/01/2017 1755   GLUCOSEU NEGATIVE 08/01/2017 1755   HGBUR SMALL (A) 08/01/2017 1755   BILIRUBINUR NEGATIVE 08/01/2017 [place] 08/01/2017 1755   PROTEINUR 100 (A) 08/01/2017 1755   NITRITE NEGATIVE 08/01/2017 1755   LEUKOCYTESUR MODERATE (A) 08/01/2017 1755   Sepsis Labs: @LABRCNTIP (procalcitonin:4,lacticidven:4) ) Recent Results (from the past 240 hour(s))  Urine Culture     Status: Abnormal   Collection Time: 08/01/17  9:08 PM  Result Value Ref Range Status   Specimen Description   Final    URINE, CLEAN CATCH Performed at [facility], [address], [place], [place] [address]    Special Requests   Final    NONE Performed at [facility], [address] [place], [place] [address]    Culture (A)  Final    >=100,000 COLONIES/mL GROUP B STREP(S.AGALACTIAE)ISOLATED TESTING AGAINST S. AGALACTIAE NOT ROUTINELY PERFORMED DUE TO PREDICTABILITY OF AMP/PEN/VAN SUSCEPTIBILITY. Performed at [facility], [place] [address], [place], [place] [address]    Report Status 08/03/2017 FINAL  Final     Scheduled Meds: . allopurinol  100 mg Oral Daily  . aspirin EC  81 mg Oral Daily  . bethanechol  5 mg Oral Daily  . docusate sodium  200 mg Oral QHS  . enoxaparin (LOVENOX) injection  30 mg Subcutaneous Q24H  . escitalopram  10 mg Oral Daily  . famotidine  20 mg Oral Daily  . feeding supplement (GLUCERNA SHAKE)  237  mL Oral TID BM  . insulin aspart  0-15 Units Subcutaneous TID WC  . insulin detemir  5 Units Subcutaneous QHS  . ipratropium-albuterol  3 mL Nebulization BID  . mouth rinse  15 mL Mouth Rinse BID  . nystatin   Topical BID  . simvastatin  40 mg Oral QPM   Continuous Infusions: . ampicillin-sulbactam (UNASYN) IV    . azithromycin Stopped (08/03/17 2249)    Procedures/Studies: Dg Chest 2 View  Result Date: 08/01/2017 CLINICAL DATA:  Worsening shortness of breath. EXAM: CHEST - 2 VIEW COMPARISON:  Chest x-ray dated March 18, 2017. FINDINGS: The heart size and mediastinal contours are within normal limits. New diffuse fine reticular interstitial thickening throughout both lungs. Right basilar atelectasis. No consolidation, pleural effusion, or pneumothorax. No acute osseous abnormality. IMPRESSION: 1. New diffuse fine reticular interstitial thickening throughout both lungs, suspicious for interstitial edema or interstitial pneumonitis related to an infectious or inflammatory etiology. Electronically Signed   By: Titus Dubin M.D.   On: 08/01/2017 16:49  Dg Swallowing Func-speech Pathology  Result Date: 08/04/2017 Objective Swallowing Evaluation: Type of Study: MBS-Modified Barium Swallow Study  Patient Details Name: Courtney Madden MRN: [id number] Date of Birth: 09/23/1940 Today's Date: 08/04/2017 Time: SLP Start Time (ACUTE ONLY): 5956 -SLP Stop Time (ACUTE ONLY): 1607 SLP Time Calculation (min) (ACUTE ONLY): 37 min Past Medical History: Past Medical History: Diagnosis Date . Anxiety  . Arthritis  . Cancer ([place])   skin cancer- face . Colon polyps  . Depression  . Diabetes ([place])  . DVT (deep venous thrombosis) ([place])  . Dysrhythmia  . GERD (gastroesophageal reflux disease)  . Gout  . Hiatal hernia   patient unaware . HTN (hypertension)  . Hyperlipidemia  . Obesity  . Pneumonia  . Shortness of breath dyspnea   currently- 04/2014 . Stage 4 chronic kidney disease ([place])  . Status post dilation of  esophageal narrowing  . Swallowing difficulty  . Tachycardia  Past Surgical History: Past Surgical History: Procedure Laterality Date . APPENDECTOMY   . BREAST CYST EXCISION Left  . ESOPHAGOSCOPY N/A 04/19/2014  Procedure: ESOPHAGOSCOPY;  Surgeon: [name], MD;  Location: [place];  Service: ENT;  Laterality: N/A; . EYE SURGERY Bilateral   Cataract withe implants . Ovarian Mass Excision   . SKIN CANCER EXCISION   . TOTAL ABDOMINAL HYSTERECTOMY   . ZENKER'S DIVERTICULECTOMY N/A 04/19/2014  Procedure: ENDOSCOPY REPAIR ZENKER'S DIVERTICULECTOMY;  Surgeon: [name], MD;  Location: [facility] OR;  Service: ENT;  Laterality: N/A; HPI: AYANAH SNADER is a 77 y.o. female with medical history significant of anxiety, depression, facial skin cancer, colon polyps, type 2 diabetes, history of DVT,  GERD/hiatal hernia, gout, hypertension, hyperlipidemia, obesity, history of pneumonia, stage IV chronic kidney disease, history of dysphagia with esophageal endoscopic dilatation, tachycardia who is coming to the emergency room due to progressively worse shortness of breath, dry cough, weakness and fatigue for the past 2 weeks.  She tried to see her PCP earlier last week, but states that her PCP was away and she was unable to see him.  Finally today, her PCP evaluated her and try to direct admit her to [facility], but then referred her to the ED at [facility], since there were no beds available for the direct admission.  She denies fever, but complains of chills.  No sore throat or rhinorrhea.  No hemoptysis, chest pain, dizziness, diaphoresis, PND, orthopnea or recent pitting edema of the lower extremities.  She complains of frequent palpitations.  No abdominal pain, nausea, vomiting, diarrhea, constipation, melena or hematochezia.  Denies dysuria or hematuria, but complains of nocturia and frequency.  No heat or cold intolerance.  Denies skin rashes or pruritus. Pt had surgery with Dr. Erik Obey [id number] to repair a large Zenker's diverticulum.   Subjective: "I have never been able to swallow pills." Assessment / Plan / Recommendation CHL IP CLINICAL IMPRESSIONS 08/04/2017 Clinical Impression Pt presents with pharyngoesophageal phase dysphagia characterized by liquids and puree entering large outpouching (confirmed Zenker's diverticulum by radiologist). No backflow into pharynx observed, however Pt is at risk for backflow and aspiration. Pt reported that she was completing self care tasks in the bathroom before the MBSS when she started coughing and "vomited". SLP reviewed imaging with Pt and advised that Pt be mindful about bending over after po intake due to risk of regurgitation/backflow. Pt reportedly had her Zenker's repaired by Dr. Erik Obey in 3329. She expresses concern about undergoing a procedure to repair it now. Pt plans to discuss options with her PCP and  ENT. Recommend D3/mech soft and thin liquids with aspiration and reflux precautions, crush meds as able in puree; f/u with ENT. I called Pt's granddaughter and relayed all information to her and provided my contact information should they have further questions.  SLP Visit Diagnosis Dysphagia, pharyngoesophageal phase (R13.14) Attention and concentration deficit following -- Frontal lobe and executive function deficit following -- Impact on safety and function Moderate aspiration risk   CHL IP TREATMENT RECOMMENDATION 08/04/2017 Treatment Recommendations No treatment recommended at this time   Prognosis 08/04/2017 Prognosis for Safe Diet Advancement Fair Barriers to Reach Goals Severity of deficits Barriers/Prognosis Comment -- CHL IP DIET RECOMMENDATION 08/04/2017 SLP Diet Recommendations Dysphagia 3 (Mech soft) solids;Thin liquid Liquid Administration via Cup;Straw Medication Administration Crushed with puree Compensations -- Postural Changes Remain semi-upright after after feeds/meals (Comment);Seated upright at 90 degrees   CHL IP OTHER RECOMMENDATIONS 08/04/2017 Recommended Consults Consider ENT  evaluation Oral Care Recommendations Oral care BID;Patient independent with oral care Other Recommendations Clarify dietary restrictions   CHL IP FOLLOW UP RECOMMENDATIONS 08/04/2017 Follow up Recommendations None   CHL IP FREQUENCY AND DURATION 08/03/2017 Speech Therapy Frequency (ACUTE ONLY) min 2x/week Treatment Duration 1 week      CHL IP ORAL PHASE 08/04/2017 Oral Phase WFL Oral - Pudding Teaspoon -- Oral - Pudding Cup -- Oral - Honey Teaspoon -- Oral - Honey Cup -- Oral - Nectar Teaspoon -- Oral - Nectar Cup -- Oral - Nectar Straw -- Oral - Thin Teaspoon -- Oral - Thin Cup -- Oral - Thin Straw -- Oral - Puree -- Oral - Mech Soft -- Oral - Regular -- Oral - Multi-Consistency -- Oral - Pill -- Oral Phase - Comment --  CHL IP PHARYNGEAL PHASE 08/04/2017 Pharyngeal Phase WFL Pharyngeal- Pudding Teaspoon -- Pharyngeal -- Pharyngeal- Pudding Cup -- Pharyngeal -- Pharyngeal- Honey Teaspoon -- Pharyngeal -- Pharyngeal- Honey Cup -- Pharyngeal -- Pharyngeal- Nectar Teaspoon -- Pharyngeal -- Pharyngeal- Nectar Cup -- Pharyngeal -- Pharyngeal- Nectar Straw -- Pharyngeal -- Pharyngeal- Thin Teaspoon -- Pharyngeal -- Pharyngeal- Thin Cup -- Pharyngeal -- Pharyngeal- Thin Straw -- Pharyngeal -- Pharyngeal- Puree -- Pharyngeal -- Pharyngeal- Mechanical Soft -- Pharyngeal -- Pharyngeal- Regular -- Pharyngeal -- Pharyngeal- Multi-consistency -- Pharyngeal -- Pharyngeal- Pill -- Pharyngeal -- Pharyngeal Comment --  CHL IP CERVICAL ESOPHAGEAL PHASE 08/04/2017 Cervical Esophageal Phase Impaired Pudding Teaspoon -- Pudding Cup -- Honey Teaspoon -- Honey Cup -- Nectar Teaspoon -- Nectar Cup -- Nectar Straw -- Thin Teaspoon -- Thin Cup Other (Comment) Thin Straw -- Puree -- Mechanical Soft -- Regular -- Multi-consistency -- Pill -- Cervical Esophageal Comment -- Thank you, Genene Churn, CCC-SLP [phone] No flowsheet data found. PORTER,DABNEY 08/04/2017, 5:15 PM               Orson Eva, DO  [facility] Pager  [phone]  If 7PM-7AM, please contact night-coverage [email or web address] Password [email or web address] 08/04/2017, 5:20 PM   LOS: 3 days

## 2017-08-04 NOTE — Care Management Note (Signed)
Case Management Note  Patient Details  Name: Courtney Madden MRN: [id number] Date of Birth: 19-Dec-1940  Subjective/Objective:     Admitted with pneumonia. Pt believed to be aspirating. Pt is from home, lives with graddaughter and was ind pta. She uses a walker with ambulation. She has PCP and her GD takes her shopping and to appointments. She was recenlty DC'd from [facility] services through [facility].                Action/Plan: DC home tomorrow with [facility] PT and SLP. Pt would like to use [facility] again. She is aware HH has 48 hrs to make first visit. Tim, [facility] rep, aware of referral and DC date. He will pull pt info from chart. Pt has no new DME needs at this time.   Expected Discharge Date:       08/05/2017           Expected Discharge Plan:  [place]  In-House Referral:  NA  Discharge planning Services  CM Consult  Post Acute Care Choice:  Home Health Choice offered to:  Patient  HH Arranged:  PT, Speech Therapy [place] Agency:  [facility]  Status of Service:  Completed, signed off  Sherald Barge, RN 08/04/2017, 12:58 PM

## 2017-08-04 NOTE — Progress Notes (Signed)
  Speech Language Pathology Treatment: Dysphagia  Patient Details Name: Courtney Madden MRN: [id number] DOB: 02/17/1941 Today's Date: 08/04/2017 Time: 6712-4580 SLP Time Calculation (min) (ACUTE ONLY): 11 min  Assessment / Plan / Recommendation Clinical Impression  SLP discussed completing objective assessment due to atypical pneumonia and history of esophageal dysphagia. SLP called radiologist to help determine if MBSS or barium swallow should be completed given history of Zenker's and it was decided to proceed with MBSS first and radiologist can come in as needed. Pt/family in agreement with plan. Will complete MBSS later this afternoon.   HPI HPI: Courtney Madden is a 77 y.o. female with medical history significant of anxiety, depression, facial skin cancer, colon polyps, type 2 diabetes, history of DVT,  GERD/hiatal hernia, gout, hypertension, hyperlipidemia, obesity, history of pneumonia, stage IV chronic kidney disease, history of dysphagia with esophageal endoscopic dilatation, tachycardia who is coming to the emergency room due to progressively worse shortness of breath, dry cough, weakness and fatigue for the past 2 weeks.  She tried to see her PCP earlier last week, but states that her PCP was away and she was unable to see him.  Finally today, her PCP evaluated her and try to direct admit her to [facility], but then referred her to the ED at [facility], since there were no beds available for the direct admission.  She denies fever, but complains of chills.  No sore throat or rhinorrhea.  No hemoptysis, chest pain, dizziness, diaphoresis, PND, orthopnea or recent pitting edema of the lower extremities.  She complains of frequent palpitations.  No abdominal pain, nausea, vomiting, diarrhea, constipation, melena or hematochezia.  Denies dysuria or hematuria, but complains of nocturia and frequency.  No heat or cold intolerance.  Denies skin rashes or pruritus. Pt had surgery with Dr. Erik Obey [id number]/8338 to  repair a large Zenker's diverticulum.      SLP Plan  MBS       Recommendations                   Plan: MBS       Thank you,  Genene Churn, [place]                [place] 08/04/2017, 1:55 PM

## 2017-08-05 LAB — RESPIRATORY PANEL BY PCR
Adenovirus: NOT DETECTED
BORDETELLA PERTUSSIS-RVPCR: NOT DETECTED
CORONAVIRUS OC43-RVPPCR: NOT DETECTED
Chlamydophila pneumoniae: NOT DETECTED
Coronavirus 229E: NOT DETECTED
Coronavirus HKU1: NOT DETECTED
Coronavirus NL63: NOT DETECTED
INFLUENZA A-RVPPCR: NOT DETECTED
INFLUENZA B-RVPPCR: NOT DETECTED
METAPNEUMOVIRUS-RVPPCR: NOT DETECTED
Mycoplasma pneumoniae: NOT DETECTED
PARAINFLUENZA VIRUS 1-RVPPCR: NOT DETECTED
PARAINFLUENZA VIRUS 2-RVPPCR: NOT DETECTED
PARAINFLUENZA VIRUS 4-RVPPCR: NOT DETECTED
Parainfluenza Virus 3: NOT DETECTED
RESPIRATORY SYNCYTIAL VIRUS-RVPPCR: NOT DETECTED
Rhinovirus / Enterovirus: NOT DETECTED

## 2017-08-05 LAB — GLUCOSE, CAPILLARY
GLUCOSE-CAPILLARY: 121 mg/dL — AB (ref 65–99)
GLUCOSE-CAPILLARY: 184 mg/dL — AB (ref 65–99)
Glucose-Capillary: 111 mg/dL — ABNORMAL HIGH (ref 65–99)

## 2017-08-05 LAB — BASIC METABOLIC PANEL
Anion gap: 9 (ref 5–15)
BUN: 16 mg/dL (ref 6–20)
CALCIUM: 8.7 mg/dL — AB (ref 8.9–10.3)
CO2: 26 mmol/L (ref 22–32)
CREATININE: 1.83 mg/dL — AB (ref 0.44–1.00)
Chloride: 107 mmol/L (ref 101–111)
GFR calc Af Amer: 29 mL/min — ABNORMAL LOW (ref >60)
GFR, EST NON AFRICAN AMERICAN: 25 mL/min — AB (ref >60)
GLUCOSE: 128 mg/dL — AB (ref 65–99)
Potassium: 4.9 mmol/L (ref 3.5–5.1)
SODIUM: 142 mmol/L (ref 135–145)

## 2017-08-05 LAB — CBC
HEMATOCRIT: 37.7 % (ref 36.0–46.0)
Hemoglobin: 11.7 g/dL — ABNORMAL LOW (ref 12.0–15.0)
MCH: 29.5 pg (ref 26.0–34.0)
MCHC: 31 g/dL (ref 30.0–36.0)
MCV: 95.2 fL (ref 78.0–100.0)
Platelets: 269 10*3/uL (ref 150–400)
RBC: 3.96 MIL/uL (ref 3.87–5.11)
RDW: 15.7 % — ABNORMAL HIGH (ref 11.5–15.5)
WBC: 9.5 10*3/uL (ref 4.0–10.5)

## 2017-08-05 MED ORDER — AZITHROMYCIN 200 MG/5ML PO SUSR: 500.0000 mg | Freq: Every day | ORAL | 0 refills | Status: AC

## 2017-08-05 MED ORDER — AMOXICILLIN-POT CLAVULANATE 250-62.5 MG/5ML PO SUSR: 500.0000 mg | Freq: Two times a day (BID) | ORAL | 0 refills | Status: AC

## 2017-08-05 MED ORDER — AMOXICILLIN-POT CLAVULANATE 250-62.5 MG/5ML PO SUSR
500.0000 mg | Freq: Two times a day (BID) | ORAL | Status: DC
  Filled 2017-08-05 (×6): qty 10

## 2017-08-05 MED ORDER — AZITHROMYCIN 200 MG/5ML PO SUSR
500.0000 mg | Freq: Every day | ORAL | Status: DC
  Filled 2017-08-05 (×3): qty 12.5

## 2017-08-05 MED ORDER — AMOXICILLIN-POT CLAVULANATE 250-62.5 MG/5ML PO SUSR: 500.0000 mg | Freq: Three times a day (TID) | ORAL | Status: DC

## 2017-08-05 NOTE — Care Management Important Message (Signed)
Important Message  Patient Details  Name: MAKYNZI EASTLAND MRN: [id number] Date of Birth: August 25, 1940   Medicare Important Message Given:  Yes    Shelda Altes 08/05/2017, 11:49 AM

## 2017-08-05 NOTE — Discharge Summary (Signed)
Physician Discharge Summary  Courtney Madden EXN:[id number] DOB: 1941/05/06 DOA: 08/01/2017  PCP: Caryl Bis, MD  Admit date: 08/01/2017 Discharge date: 08/05/2017  Admitted From:  Home Disposition:  Home   Recommendations for Outpatient Follow-up:  1. Follow up with PCP in 1-2 weeks 2. 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 The patient saw her primary care provider 2 weeks ago for similar symptoms and symptomatic therapy was recommended. Her symptoms continue to progress. She went back to see her primary care provider on the morning of 08/01/2017, and the patient was recommended to go to emergency department for further evaluation. Chest x-ray showed diffuse reticular interstitial opacities bilateral. The patient was started on Zosyn and azithromycin initially. She also notes some difficulty swallowing pills and solid foods in past few weeks.  Speech therapy was consulted to assist with management.  Modified barium swallow was performed and revealed a large Zenker's diverticulum.  Medication strategies were provided for the patient to minimize aspiration.  The patient improved clinically.  She remained stable on room air.  The patient will need to follow-up with her previous ENT, Dr. [name], at [facility] for surgical options.    Discharge Diagnoses:  Aspiration Pneumonia -atypical vs aspiration -urine legionella neg -appreciate speech therapy eval-->Dys 3 with thin -MBS confirmed large zenker's diverticululm which is the likely etiology of  aspiration/dysphagia -continue azithro -switch zosyn to unasyn -continue bronchodilators -home with amox/clav suspension x 4 more days to complete 1 week of tx -home with azithromycin suspension x 4 more days  UTI -3/25 UA--TNTC WBC -urine culture with GBS -home with amox clav  Diabetes mellitus type 2, uncontrolled with nephropathy -08/02/2017 hemoglobin A1c 8.7 -Continue Levemir and NovoLog sliding scale while in the hospital -Holding oral hypoglycemic agents while in the hospital  Hyperlipidemia -Continue statin  CKD stage IV -Baseline creatinine 1.6-2.0 -A.m. BMP -serum creatinine 1.83 on day of d/c  Physical deconditioning -PT evaluation-->home health PT  Depression/anxiety -Continue Lexapro  Moderate malnutrition -continue glucerna     Discharge Instructions  Discharge Instructions    Diet - low sodium heart healthy   Complete by:  As directed    Increase activity slowly   Complete by:  As directed      Allergies as of 08/05/2017      Reactions   Ace Inhibitors Cough   Diovan [valsartan]    Headache   Zocor [simvastatin]    Muscle pain      Medication List    TAKE these medications   allopurinol 100 MG tablet Commonly known as:  ZYLOPRIM Take 100 mg by mouth daily.   amoxicillin-clavulanate 250-62.5 MG/5ML suspension Commonly known as:  AUGMENTIN Take 10 mLs (500 mg total) by mouth every 12 (twelve) hours.   aspirin 81 MG tablet Take 81 mg by mouth daily.   azithromycin 200 MG/5ML suspension Commonly known as:  ZITHROMAX Take 12.5 mLs (500 mg total) by mouth at bedtime.   bethanechol 5 MG tablet Commonly known as:  URECHOLINE Take 5 mg by mouth daily.   escitalopram 10 MG tablet Commonly known as:  LEXAPRO TAKE ONE TABLET BY MOUTH DAILY FOR DEPRESSION  glipiZIDE 5 MG tablet Commonly known as:  GLUCOTROL Take 5 mg by mouth 2 (two) times daily.   LORazepam 0.5 MG tablet Commonly known as:  ATIVAN Take 0.5 mg by mouth as  needed for anxiety.   oxyCODONE-acetaminophen 5-325 MG tablet Commonly known as:  PERCOCET/ROXICET Take 1 tablet by mouth every 4 (four) hours as needed for pain.   ranitidine 150 MG tablet Commonly known as:  ZANTAC Take 150 mg by mouth 2 (two) times daily.   simvastatin 40 MG tablet Commonly known as:  ZOCOR Take 40 mg by mouth every evening.   sitaGLIPtin 100 MG tablet Commonly known as:  JANUVIA Take 100 mg by mouth daily.       Allergies  Allergen Reactions  . Ace Inhibitors Cough  . Diovan [Valsartan]     Headache   . Zocor [Simvastatin]     Muscle pain    Consultations:  none   Procedures/Studies: Dg Chest 2 View  Result Date: 08/01/2017 CLINICAL DATA:  Worsening shortness of breath. EXAM: CHEST - 2 VIEW COMPARISON:  Chest x-ray dated March 18, 2017. FINDINGS: The heart size and mediastinal contours are within normal limits. New diffuse fine reticular interstitial thickening throughout both lungs. Right basilar atelectasis. No consolidation, pleural effusion, or pneumothorax. No acute osseous abnormality. IMPRESSION: 1. New diffuse fine reticular interstitial thickening throughout both lungs, suspicious for interstitial edema or interstitial pneumonitis related to an infectious or inflammatory etiology. Electronically Signed   By: Titus Dubin M.D.   On: 08/01/2017 16:49   Dg Swallowing Func-speech Pathology  Result Date: 08/04/2017 Objective Swallowing Evaluation: Type of Study: MBS-Modified Barium Swallow Study  Patient Details Name: Courtney Madden MRN: [id number] Date of Birth: 1940-06-02 Today's Date: 08/04/2017 Time: SLP Start Time (ACUTE ONLY): 9678 -SLP Stop Time (ACUTE ONLY): 1607 SLP Time Calculation (min) (ACUTE ONLY): 37 min Past Medical History: Past Medical History: Diagnosis Date . Anxiety  . Arthritis  . Cancer ([place])   skin cancer- face . Colon polyps  . Depression  . Diabetes ([place])  . DVT (deep venous thrombosis) ([place])  . Dysrhythmia  . GERD  (gastroesophageal reflux disease)  . Gout  . Hiatal hernia   patient unaware . HTN (hypertension)  . Hyperlipidemia  . Obesity  . Pneumonia  . Shortness of breath dyspnea   currently- 04/2014 . Stage 4 chronic kidney disease ([place])  . Status post dilation of esophageal narrowing  . Swallowing difficulty  . Tachycardia  Past Surgical History: Past Surgical History: Procedure Laterality Date . APPENDECTOMY   . BREAST CYST EXCISION Left  . ESOPHAGOSCOPY N/A 04/19/2014  Procedure: ESOPHAGOSCOPY;  Surgeon: [name], MD;  Location: [facility];  Service: ENT;  Laterality: N/A; . EYE SURGERY Bilateral   Cataract withe implants . Ovarian Mass Excision   . SKIN CANCER EXCISION   . TOTAL ABDOMINAL HYSTERECTOMY   . ZENKER'S DIVERTICULECTOMY N/A 04/19/2014  Procedure: ENDOSCOPY REPAIR ZENKER'S DIVERTICULECTOMY;  Surgeon: [name], MD;  Location: [facility] OR;  Service: ENT;  Laterality: N/A; HPI: SHANETRA BLUMENSTOCK is a 78 y.o. female with medical history significant of anxiety, depression, facial skin cancer, colon polyps, type 2 diabetes, history of DVT,  GERD/hiatal hernia, gout, hypertension, hyperlipidemia, obesity, history of pneumonia, stage IV chronic kidney disease, history of dysphagia with esophageal endoscopic dilatation, tachycardia who is coming to the emergency room due to progressively worse shortness of breath, dry cough, weakness and fatigue for the past 2 weeks.  She tried to see her PCP earlier  last week, but states that her PCP was away and she was unable to see him.  Finally today, her PCP evaluated her and try to direct admit her to [facility], but then referred her to the ED at [facility], since there were no beds available for the direct admission.  She denies fever, but complains of chills.  No sore throat or rhinorrhea.  No hemoptysis, chest pain, dizziness, diaphoresis, PND, orthopnea or recent pitting edema of the lower extremities.  She complains of frequent palpitations.  No abdominal pain, nausea, vomiting,  diarrhea, constipation, melena or hematochezia.  Denies dysuria or hematuria, but complains of nocturia and frequency.  No heat or cold intolerance.  Denies skin rashes or pruritus. Pt had surgery with Dr. Erik Obey [id number]/8469 to repair a large Zenker's diverticulum.  Subjective: "I have never been able to swallow pills." Assessment / Plan / Recommendation CHL IP CLINICAL IMPRESSIONS 08/04/2017 Clinical Impression Pt presents with pharyngoesophageal phase dysphagia characterized by liquids and puree entering large outpouching (confirmed Zenker's diverticulum by radiologist). No backflow into pharynx observed, however Pt is at risk for backflow and aspiration. Pt reported that she was completing self care tasks in the bathroom before the MBSS when she started coughing and "vomited". SLP reviewed imaging with Pt and advised that Pt be mindful about bending over after po intake due to risk of regurgitation/backflow. Pt reportedly had her Zenker's repaired by Dr. Erik Obey in 6295. She expresses concern about undergoing a procedure to repair it now. Pt plans to discuss options with her PCP and ENT. Recommend D3/mech soft and thin liquids with aspiration and reflux precautions, crush meds as able in puree; f/u with ENT. I called Pt's granddaughter and relayed all information to her and provided my contact information should they have further questions.  SLP Visit Diagnosis Dysphagia, pharyngoesophageal phase (R13.14) Attention and concentration deficit following -- Frontal lobe and executive function deficit following -- Impact on safety and function Moderate aspiration risk   CHL IP TREATMENT RECOMMENDATION 08/04/2017 Treatment Recommendations No treatment recommended at this time   Prognosis 08/04/2017 Prognosis for Safe Diet Advancement Fair Barriers to Reach Goals Severity of deficits Barriers/Prognosis Comment -- CHL IP DIET RECOMMENDATION 08/04/2017 SLP Diet Recommendations Dysphagia 3 (Mech soft) solids;Thin liquid Liquid  Administration via Cup;Straw Medication Administration Crushed with puree Compensations -- Postural Changes Remain semi-upright after after feeds/meals (Comment);Seated upright at 90 degrees   CHL IP OTHER RECOMMENDATIONS 08/04/2017 Recommended Consults Consider ENT evaluation Oral Care Recommendations Oral care BID;Patient independent with oral care Other Recommendations Clarify dietary restrictions   CHL IP FOLLOW UP RECOMMENDATIONS 08/04/2017 Follow up Recommendations None   CHL IP FREQUENCY AND DURATION 08/03/2017 Speech Therapy Frequency (ACUTE ONLY) min 2x/week Treatment Duration 1 week      CHL IP ORAL PHASE 08/04/2017 Oral Phase WFL Oral - Pudding Teaspoon -- Oral - Pudding Cup -- Oral - Honey Teaspoon -- Oral - Honey Cup -- Oral - Nectar Teaspoon -- Oral - Nectar Cup -- Oral - Nectar Straw -- Oral - Thin Teaspoon -- Oral - Thin Cup -- Oral - Thin Straw -- Oral - Puree -- Oral - Mech Soft -- Oral - Regular -- Oral - Multi-Consistency -- Oral - Pill -- Oral Phase - Comment --  CHL IP PHARYNGEAL PHASE 08/04/2017 Pharyngeal Phase WFL Pharyngeal- Pudding Teaspoon -- Pharyngeal -- Pharyngeal- Pudding Cup -- Pharyngeal -- Pharyngeal- Honey Teaspoon -- Pharyngeal -- Pharyngeal- Honey Cup -- Pharyngeal -- Pharyngeal- Nectar Teaspoon -- Pharyngeal -- Pharyngeal- Nectar Cup -- Pharyngeal --  Pharyngeal- Nectar Straw -- Pharyngeal -- Pharyngeal- Thin Teaspoon -- Pharyngeal -- Pharyngeal- Thin Cup -- Pharyngeal -- Pharyngeal- Thin Straw -- Pharyngeal -- Pharyngeal- Puree -- Pharyngeal -- Pharyngeal- Mechanical Soft -- Pharyngeal -- Pharyngeal- Regular -- Pharyngeal -- Pharyngeal- Multi-consistency -- Pharyngeal -- Pharyngeal- Pill -- Pharyngeal -- Pharyngeal Comment --  CHL IP CERVICAL ESOPHAGEAL PHASE 08/04/2017 Cervical Esophageal Phase Impaired Pudding Teaspoon -- Pudding Cup -- Honey Teaspoon -- Honey Cup -- Nectar Teaspoon -- Nectar Cup -- Nectar Straw -- Thin Teaspoon -- Thin Cup Other (Comment) Thin Straw -- Puree --  Mechanical Soft -- Regular -- Multi-consistency -- Pill -- Cervical Esophageal Comment -- Thank you, Genene Churn, [place] No flowsheet data found. PORTER,DABNEY 08/04/2017, 5:15 PM                    Discharge Exam: Vitals:   08/05/17 0620 08/05/17 0826  BP: 111/67   Pulse: 91   Resp: 17   Temp: 98.2 F (36.8 C)   SpO2: 95% 91%   Vitals:   08/04/17 1300 08/04/17 2015 08/05/17 0620 08/05/17 0826  BP: 126/74 117/70 111/67   Pulse: 69 94 91   Resp: 18 19 17    Temp: 98.3 F (36.8 C) 98.4 F (36.9 C) 98.2 F (36.8 C)   TempSrc: Oral Oral Oral   SpO2: 99% 97% 95% 91%  Weight:   79.4 kg (175 lb 0.7 oz)   Height:        General: Pt is alert, awake, not in acute distress Cardiovascular: RRR, S1/S2 +, no rubs, no gallops Respiratory: bilateral crackles, no wheeze Abdominal: Soft, NT, ND, bowel sounds + Extremities: no edema, no cyanosis   The results of significant diagnostics from this hospitalization (including imaging, microbiology, ancillary and laboratory) are listed below for reference.    Significant Diagnostic Studies: Dg Chest 2 View  Result Date: 08/01/2017 CLINICAL DATA:  Worsening shortness of breath. EXAM: CHEST - 2 VIEW COMPARISON:  Chest x-ray dated March 18, 2017. FINDINGS: The heart size and mediastinal contours are within normal limits. New diffuse fine reticular interstitial thickening throughout both lungs. Right basilar atelectasis. No consolidation, pleural effusion, or pneumothorax. No acute osseous abnormality. IMPRESSION: 1. New diffuse fine reticular interstitial thickening throughout both lungs, suspicious for interstitial edema or interstitial pneumonitis related to an infectious or inflammatory etiology. Electronically Signed   By: Titus Dubin M.D.   On: 08/01/2017 16:49   Dg Swallowing Func-speech Pathology  Result Date: 08/04/2017 Objective Swallowing Evaluation: Type of Study: MBS-Modified Barium Swallow Study  Patient Details  Name: Courtney Madden MRN: [id number] Date of Birth: February 06, 1941 Today's Date: 08/04/2017 Time: SLP Start Time (ACUTE ONLY): 7829 -SLP Stop Time (ACUTE ONLY): 1607 SLP Time Calculation (min) (ACUTE ONLY): 37 min Past Medical History: Past Medical History: Diagnosis Date . Anxiety  . Arthritis  . Cancer ([place])   skin cancer- face . Colon polyps  . Depression  . Diabetes ([place])  . DVT (deep venous thrombosis) ([place])  . Dysrhythmia  . GERD (gastroesophageal reflux disease)  . Gout  . Hiatal hernia   patient unaware . HTN (hypertension)  . Hyperlipidemia  . Obesity  . Pneumonia  . Shortness of breath dyspnea   currently- 04/2014 . Stage 4 chronic kidney disease ([place])  . Status post dilation of esophageal narrowing  . Swallowing difficulty  . Tachycardia  Past Surgical History: Past Surgical History: Procedure Laterality Date . APPENDECTOMY   . BREAST CYST EXCISION Left  . ESOPHAGOSCOPY N/A 04/19/2014  Procedure: ESOPHAGOSCOPY;  Surgeon: [name], MD;  Location: [facility];  Service: ENT;  Laterality: N/A; . EYE SURGERY Bilateral   Cataract withe implants . Ovarian Mass Excision   . SKIN CANCER EXCISION   . TOTAL ABDOMINAL HYSTERECTOMY   . ZENKER'S DIVERTICULECTOMY N/A 04/19/2014  Procedure: ENDOSCOPY REPAIR ZENKER'S DIVERTICULECTOMY;  Surgeon: [name], MD;  Location: [facility] OR;  Service: ENT;  Laterality: N/A; HPI: BUSHRA DENMAN is a 77 y.o. female with medical history significant of anxiety, depression, facial skin cancer, colon polyps, type 2 diabetes, history of DVT,  GERD/hiatal hernia, gout, hypertension, hyperlipidemia, obesity, history of pneumonia, stage IV chronic kidney disease, history of dysphagia with esophageal endoscopic dilatation, tachycardia who is coming to the emergency room due to progressively worse shortness of breath, dry cough, weakness and fatigue for the past 2 weeks.  She tried to see her PCP earlier last week, but states that her PCP was away and she was unable to see him.  Finally today,  her PCP evaluated her and try to direct admit her to [facility], but then referred her to the ED at [facility], since there were no beds available for the direct admission.  She denies fever, but complains of chills.  No sore throat or rhinorrhea.  No hemoptysis, chest pain, dizziness, diaphoresis, PND, orthopnea or recent pitting edema of the lower extremities.  She complains of frequent palpitations.  No abdominal pain, nausea, vomiting, diarrhea, constipation, melena or hematochezia.  Denies dysuria or hematuria, but complains of nocturia and frequency.  No heat or cold intolerance.  Denies skin rashes or pruritus. Pt had surgery with Dr. Erik Obey [id number]/5462 to repair a large Zenker's diverticulum.  Subjective: "I have never been able to swallow pills." Assessment / Plan / Recommendation CHL IP CLINICAL IMPRESSIONS 08/04/2017 Clinical Impression Pt presents with pharyngoesophageal phase dysphagia characterized by liquids and puree entering large outpouching (confirmed Zenker's diverticulum by radiologist). No backflow into pharynx observed, however Pt is at risk for backflow and aspiration. Pt reported that she was completing self care tasks in the bathroom before the MBSS when she started coughing and "vomited". SLP reviewed imaging with Pt and advised that Pt be mindful about bending over after po intake due to risk of regurgitation/backflow. Pt reportedly had her Zenker's repaired by Dr. Erik Obey in 7035. She expresses concern about undergoing a procedure to repair it now. Pt plans to discuss options with her PCP and ENT. Recommend D3/mech soft and thin liquids with aspiration and reflux precautions, crush meds as able in puree; f/u with ENT. I called Pt's granddaughter and relayed all information to her and provided my contact information should they have further questions.  SLP Visit Diagnosis Dysphagia, pharyngoesophageal phase (R13.14) Attention and concentration deficit following -- Frontal lobe and executive function  deficit following -- Impact on safety and function Moderate aspiration risk   CHL IP TREATMENT RECOMMENDATION 08/04/2017 Treatment Recommendations No treatment recommended at this time   Prognosis 08/04/2017 Prognosis for Safe Diet Advancement Fair Barriers to Reach Goals Severity of deficits Barriers/Prognosis Comment -- CHL IP DIET RECOMMENDATION 08/04/2017 SLP Diet Recommendations Dysphagia 3 (Mech soft) solids;Thin liquid Liquid Administration via Cup;Straw Medication Administration Crushed with puree Compensations -- Postural Changes Remain semi-upright after after feeds/meals (Comment);Seated upright at 90 degrees   CHL IP OTHER RECOMMENDATIONS 08/04/2017 Recommended Consults Consider ENT evaluation Oral Care Recommendations Oral care BID;Patient independent with oral care Other Recommendations Clarify dietary restrictions   CHL IP FOLLOW UP RECOMMENDATIONS 08/04/2017 Follow up Recommendations None  CHL IP FREQUENCY AND DURATION 08/03/2017 Speech Therapy Frequency (ACUTE ONLY) min 2x/week Treatment Duration 1 week      CHL IP ORAL PHASE 08/04/2017 Oral Phase WFL Oral - Pudding Teaspoon -- Oral - Pudding Cup -- Oral - Honey Teaspoon -- Oral - Honey Cup -- Oral - Nectar Teaspoon -- Oral - Nectar Cup -- Oral - Nectar Straw -- Oral - Thin Teaspoon -- Oral - Thin Cup -- Oral - Thin Straw -- Oral - Puree -- Oral - Mech Soft -- Oral - Regular -- Oral - Multi-Consistency -- Oral - Pill -- Oral Phase - Comment --  CHL IP PHARYNGEAL PHASE 08/04/2017 Pharyngeal Phase WFL Pharyngeal- Pudding Teaspoon -- Pharyngeal -- Pharyngeal- Pudding Cup -- Pharyngeal -- Pharyngeal- Honey Teaspoon -- Pharyngeal -- Pharyngeal- Honey Cup -- Pharyngeal -- Pharyngeal- Nectar Teaspoon -- Pharyngeal -- Pharyngeal- Nectar Cup -- Pharyngeal -- Pharyngeal- Nectar Straw -- Pharyngeal -- Pharyngeal- Thin Teaspoon -- Pharyngeal -- Pharyngeal- Thin Cup -- Pharyngeal -- Pharyngeal- Thin Straw -- Pharyngeal -- Pharyngeal- Puree -- Pharyngeal -- Pharyngeal-  Mechanical Soft -- Pharyngeal -- Pharyngeal- Regular -- Pharyngeal -- Pharyngeal- Multi-consistency -- Pharyngeal -- Pharyngeal- Pill -- Pharyngeal -- Pharyngeal Comment --  CHL IP CERVICAL ESOPHAGEAL PHASE 08/04/2017 Cervical Esophageal Phase Impaired Pudding Teaspoon -- Pudding Cup -- Honey Teaspoon -- Honey Cup -- Nectar Teaspoon -- Nectar Cup -- Nectar Straw -- Thin Teaspoon -- Thin Cup Other (Comment) Thin Straw -- Puree -- Mechanical Soft -- Regular -- Multi-consistency -- Pill -- Cervical Esophageal Comment -- Thank you, Genene Churn, [place] No flowsheet data found. PORTER,DABNEY 08/04/2017, 5:15 PM                Microbiology: Recent Results (from the past 240 hour(s))  Urine Culture     Status: Abnormal   Collection Time: 08/01/17  9:08 PM  Result Value Ref Range Status   Specimen Description   Final    URINE, CLEAN CATCH Performed at [facility], [address] [place], [place] [address]    Special Requests   Final    NONE Performed at [facility], [address] [place], [place] [address]    Culture (A)  Final    >=100,000 COLONIES/mL GROUP B STREP(S.AGALACTIAE)ISOLATED TESTING AGAINST S. AGALACTIAE NOT ROUTINELY PERFORMED DUE TO PREDICTABILITY OF AMP/PEN/VAN SUSCEPTIBILITY. Performed at [facility], [place] [address], [place], [place] [address]    Report Status 08/03/2017 FINAL  Final     Labs: Basic Metabolic Panel: Recent Labs  Lab 08/01/17 1701 08/02/17 0550 08/03/17 0536 08/05/17 0442  NA 137 141 141 142  K 3.8 4.4 4.0 4.9  CL 101 105 106 107  CO2 25 26 25 26   GLUCOSE 142* 83 74 128*  BUN 20 19 21* 16  CREATININE 2.00* 2.07* 2.08* 1.83*  CALCIUM 9.2 9.1 8.9 8.7*   Liver Function Tests: Recent Labs  Lab 08/01/17 1701  AST 15  ALT 8*  ALKPHOS 77  BILITOT 0.5  PROT 7.7  ALBUMIN 3.1*   No results for input(s): LIPASE, AMYLASE in the last 168 hours. No results for input(s): AMMONIA in the last 168 hours. CBC: Recent Labs  Lab  08/01/17 1701 08/02/17 0550 08/03/17 0536 08/04/17 0631 08/05/17 0442  WBC 10.9* 8.4 8.5 9.5 9.5  NEUTROABS 7.2 3.9 4.8 5.3  --   HGB 13.8 12.8 12.4 11.7* 11.7*  HCT 43.2 41.3 39.7 37.5 37.7  MCV 93.9 94.5 94.7 94.5 95.2  PLT 278 253 246 275 269  Cardiac Enzymes: Recent Labs  Lab 08/01/17 1701  TROPONINI <0.03   BNP: Invalid input(s): POCBNP CBG: Recent Labs  Lab 08/04/17 1623 08/04/17 2145 08/04/17 2340 08/05/17 0755 08/05/17 1058  GLUCAP 149* 61* 153* 121* 184*    Time coordinating discharge:  Greater than 30 minutes  Signed:  Orson Eva, DO [facility] Pager: [phone] 08/05/2017, 3:50 PM

## 2017-08-05 NOTE — ACP (Advance Care Planning) (Signed)
Brought Advance Directive material to Ms Yono and discussed. She was unsure if she wished to complete. Gave her contact information if she chooses to complete.

## 2017-08-05 NOTE — Progress Notes (Signed)
IV removed and discharge papers reviewed.  To follow up with primary and have lab work .  To pick up meds from [facility].  Family to drive home

## 2017-08-05 NOTE — Discharge Instructions (Signed)
[facility] Hospital Stay Proper nutrition can help your body recover from illness and injury.   Foods and beverages high in protein, vitamins, and minerals help rebuild muscle loss, promote healing, & reduce fall risk.   In addition to eating healthy foods, a nutrition shake is an easy, delicious way to get the nutrition you need during and after your hospital stay  It is recommended that you continue to drink 2 bottles per day of:       Glucerna for at least 1 month (30 days) after your hospital stay   Tips for adding a nutrition shake into your routine: As allowed, drink one with vitamins or medications instead of water or juice Enjoy one as a tasty mid-morning or afternoon snack Drink cold or make a milkshake out of it Drink one instead of milk with cereal or snacks Use as a coffee creamer   Available at the following grocery stores and pharmacies:           * [facility] [phone]            For COUPONS visit: www.ensure.com/join or http://dawson-may.[email or web address]/   Suggested Substitutions Ensure Plus = Boost Plus = Carnation Breakfast Essentials = Boost Compact Ensure Active Clear = Boost Breeze Glucerna Shake = Boost Glucose Control = Carnation Breakfast Essentials SUGAR FREE    FOLLOW UP WITH PRIMARY DOCTOR IN ONE TO TWO WEEKS HAVE LABS DRAWN      BMP   AND   CBC

## 2017-08-09 DIAGNOSIS — I10 Essential (primary) hypertension: Secondary | ICD-10-CM | POA: Diagnosis not present

## 2017-08-09 DIAGNOSIS — E1142 Type 2 diabetes mellitus with diabetic polyneuropathy: Secondary | ICD-10-CM | POA: Diagnosis not present

## 2017-08-09 DIAGNOSIS — E782 Mixed hyperlipidemia: Secondary | ICD-10-CM | POA: Diagnosis not present

## 2017-08-09 DIAGNOSIS — Z1389 Encounter for screening for other disorder: Secondary | ICD-10-CM | POA: Diagnosis not present

## 2017-08-09 DIAGNOSIS — E1122 Type 2 diabetes mellitus with diabetic chronic kidney disease: Secondary | ICD-10-CM | POA: Diagnosis not present

## 2017-08-09 DIAGNOSIS — M1000 Idiopathic gout, unspecified site: Secondary | ICD-10-CM | POA: Diagnosis not present

## 2017-08-12 DIAGNOSIS — M109 Gout, unspecified: Secondary | ICD-10-CM | POA: Diagnosis not present

## 2017-08-12 DIAGNOSIS — E1140 Type 2 diabetes mellitus with diabetic neuropathy, unspecified: Secondary | ICD-10-CM | POA: Diagnosis not present

## 2017-08-12 DIAGNOSIS — Z79891 Long term (current) use of opiate analgesic: Secondary | ICD-10-CM | POA: Diagnosis not present

## 2017-08-12 DIAGNOSIS — N184 Chronic kidney disease, stage 4 (severe): Secondary | ICD-10-CM | POA: Diagnosis not present

## 2017-08-12 DIAGNOSIS — I129 Hypertensive chronic kidney disease with stage 1 through stage 4 chronic kidney disease, or unspecified chronic kidney disease: Secondary | ICD-10-CM | POA: Diagnosis not present

## 2017-08-12 DIAGNOSIS — Z7982 Long term (current) use of aspirin: Secondary | ICD-10-CM | POA: Diagnosis not present

## 2017-08-12 DIAGNOSIS — M6281 Muscle weakness (generalized): Secondary | ICD-10-CM | POA: Diagnosis not present

## 2017-08-12 DIAGNOSIS — M1991 Primary osteoarthritis, unspecified site: Secondary | ICD-10-CM | POA: Diagnosis not present

## 2017-08-12 DIAGNOSIS — E1122 Type 2 diabetes mellitus with diabetic chronic kidney disease: Secondary | ICD-10-CM | POA: Diagnosis not present

## 2017-08-12 DIAGNOSIS — Z87440 Personal history of urinary (tract) infections: Secondary | ICD-10-CM | POA: Diagnosis not present

## 2017-08-12 DIAGNOSIS — J849 Interstitial pulmonary disease, unspecified: Secondary | ICD-10-CM | POA: Diagnosis not present

## 2017-08-12 DIAGNOSIS — E46 Unspecified protein-calorie malnutrition: Secondary | ICD-10-CM | POA: Diagnosis not present

## 2017-08-15 ENCOUNTER — Other Ambulatory Visit: Payer: Self-pay

## 2017-08-15 NOTE — Patient Outreach (Signed)
[facility]) Care Management  08/15/2017  Courtney Madden 1940-07-05 [id number]   EMMI- General Discharge RED ON EMMI ALERT Day # 4 Date: 08/12/17 Red Alert Reason:  Other questions/problems? Yes Lost interest in things? Yes Sad/hopeless/anxious/empty? yes  Outreach attempt # 1 Telephone call to patient for follow up.  Spoke with patient she is able to verify HIPAA.  Patient reports that she is doing ok.  Addressed red alert with patient.  She denies any problem with either alert. Patient states that she gets so many phone call these days. Patient states that she has an appointment with the ENT on Thursday to follow up on her problem with swallowing.  Asked patient if she had a questions or concerns.  She declined.     Plan: RN CM will send letter and brochure. RN CM will close case.    Jone Baseman, RN, MSN [facility] Care Management Care Management Coordinator Direct Line [phone] Toll Free: [phone]  Fax: [phone]

## 2017-08-17 DIAGNOSIS — M1991 Primary osteoarthritis, unspecified site: Secondary | ICD-10-CM | POA: Diagnosis not present

## 2017-08-17 DIAGNOSIS — N184 Chronic kidney disease, stage 4 (severe): Secondary | ICD-10-CM | POA: Diagnosis not present

## 2017-08-17 DIAGNOSIS — E1140 Type 2 diabetes mellitus with diabetic neuropathy, unspecified: Secondary | ICD-10-CM | POA: Diagnosis not present

## 2017-08-17 DIAGNOSIS — E46 Unspecified protein-calorie malnutrition: Secondary | ICD-10-CM | POA: Diagnosis not present

## 2017-08-17 DIAGNOSIS — M6281 Muscle weakness (generalized): Secondary | ICD-10-CM | POA: Diagnosis not present

## 2017-08-17 DIAGNOSIS — E1122 Type 2 diabetes mellitus with diabetic chronic kidney disease: Secondary | ICD-10-CM | POA: Diagnosis not present

## 2017-08-17 DIAGNOSIS — J849 Interstitial pulmonary disease, unspecified: Secondary | ICD-10-CM | POA: Diagnosis not present

## 2017-08-17 DIAGNOSIS — M109 Gout, unspecified: Secondary | ICD-10-CM | POA: Diagnosis not present

## 2017-08-17 DIAGNOSIS — Z7982 Long term (current) use of aspirin: Secondary | ICD-10-CM | POA: Diagnosis not present

## 2017-08-17 DIAGNOSIS — Z87440 Personal history of urinary (tract) infections: Secondary | ICD-10-CM | POA: Diagnosis not present

## 2017-08-17 DIAGNOSIS — I129 Hypertensive chronic kidney disease with stage 1 through stage 4 chronic kidney disease, or unspecified chronic kidney disease: Secondary | ICD-10-CM | POA: Diagnosis not present

## 2017-08-17 DIAGNOSIS — Z79891 Long term (current) use of opiate analgesic: Secondary | ICD-10-CM | POA: Diagnosis not present

## 2017-08-19 DIAGNOSIS — M109 Gout, unspecified: Secondary | ICD-10-CM | POA: Diagnosis not present

## 2017-08-19 DIAGNOSIS — Z7982 Long term (current) use of aspirin: Secondary | ICD-10-CM | POA: Diagnosis not present

## 2017-08-19 DIAGNOSIS — E46 Unspecified protein-calorie malnutrition: Secondary | ICD-10-CM | POA: Diagnosis not present

## 2017-08-19 DIAGNOSIS — N184 Chronic kidney disease, stage 4 (severe): Secondary | ICD-10-CM | POA: Diagnosis not present

## 2017-08-19 DIAGNOSIS — M6281 Muscle weakness (generalized): Secondary | ICD-10-CM | POA: Diagnosis not present

## 2017-08-19 DIAGNOSIS — I129 Hypertensive chronic kidney disease with stage 1 through stage 4 chronic kidney disease, or unspecified chronic kidney disease: Secondary | ICD-10-CM | POA: Diagnosis not present

## 2017-08-19 DIAGNOSIS — Z87440 Personal history of urinary (tract) infections: Secondary | ICD-10-CM | POA: Diagnosis not present

## 2017-08-19 DIAGNOSIS — J849 Interstitial pulmonary disease, unspecified: Secondary | ICD-10-CM | POA: Diagnosis not present

## 2017-08-19 DIAGNOSIS — E1140 Type 2 diabetes mellitus with diabetic neuropathy, unspecified: Secondary | ICD-10-CM | POA: Diagnosis not present

## 2017-08-19 DIAGNOSIS — Z79891 Long term (current) use of opiate analgesic: Secondary | ICD-10-CM | POA: Diagnosis not present

## 2017-08-19 DIAGNOSIS — E1122 Type 2 diabetes mellitus with diabetic chronic kidney disease: Secondary | ICD-10-CM | POA: Diagnosis not present

## 2017-08-19 DIAGNOSIS — M1991 Primary osteoarthritis, unspecified site: Secondary | ICD-10-CM | POA: Diagnosis not present

## 2017-08-22 DIAGNOSIS — M109 Gout, unspecified: Secondary | ICD-10-CM | POA: Diagnosis not present

## 2017-08-22 DIAGNOSIS — M6281 Muscle weakness (generalized): Secondary | ICD-10-CM | POA: Diagnosis not present

## 2017-08-22 DIAGNOSIS — I129 Hypertensive chronic kidney disease with stage 1 through stage 4 chronic kidney disease, or unspecified chronic kidney disease: Secondary | ICD-10-CM | POA: Diagnosis not present

## 2017-08-22 DIAGNOSIS — E1140 Type 2 diabetes mellitus with diabetic neuropathy, unspecified: Secondary | ICD-10-CM | POA: Diagnosis not present

## 2017-08-22 DIAGNOSIS — J849 Interstitial pulmonary disease, unspecified: Secondary | ICD-10-CM | POA: Diagnosis not present

## 2017-08-22 DIAGNOSIS — M1991 Primary osteoarthritis, unspecified site: Secondary | ICD-10-CM | POA: Diagnosis not present

## 2017-08-22 DIAGNOSIS — N184 Chronic kidney disease, stage 4 (severe): Secondary | ICD-10-CM | POA: Diagnosis not present

## 2017-08-22 DIAGNOSIS — E1122 Type 2 diabetes mellitus with diabetic chronic kidney disease: Secondary | ICD-10-CM | POA: Diagnosis not present

## 2017-08-22 DIAGNOSIS — E46 Unspecified protein-calorie malnutrition: Secondary | ICD-10-CM | POA: Diagnosis not present

## 2017-08-22 DIAGNOSIS — Z79891 Long term (current) use of opiate analgesic: Secondary | ICD-10-CM | POA: Diagnosis not present

## 2017-08-22 DIAGNOSIS — Z7982 Long term (current) use of aspirin: Secondary | ICD-10-CM | POA: Diagnosis not present

## 2017-08-22 DIAGNOSIS — Z87440 Personal history of urinary (tract) infections: Secondary | ICD-10-CM | POA: Diagnosis not present

## 2017-08-23 DIAGNOSIS — Z87891 Personal history of nicotine dependence: Secondary | ICD-10-CM | POA: Diagnosis not present

## 2017-08-23 DIAGNOSIS — J330 Polyp of nasal cavity: Secondary | ICD-10-CM | POA: Diagnosis not present

## 2017-08-23 DIAGNOSIS — K225 Diverticulum of esophagus, acquired: Secondary | ICD-10-CM | POA: Diagnosis not present

## 2017-08-23 DIAGNOSIS — K219 Gastro-esophageal reflux disease without esophagitis: Secondary | ICD-10-CM | POA: Diagnosis not present

## 2017-08-23 DIAGNOSIS — R1314 Dysphagia, pharyngoesophageal phase: Secondary | ICD-10-CM | POA: Diagnosis not present

## 2017-08-24 DIAGNOSIS — E1122 Type 2 diabetes mellitus with diabetic chronic kidney disease: Secondary | ICD-10-CM | POA: Diagnosis not present

## 2017-08-24 DIAGNOSIS — Z79891 Long term (current) use of opiate analgesic: Secondary | ICD-10-CM | POA: Diagnosis not present

## 2017-08-24 DIAGNOSIS — J849 Interstitial pulmonary disease, unspecified: Secondary | ICD-10-CM | POA: Diagnosis not present

## 2017-08-24 DIAGNOSIS — I129 Hypertensive chronic kidney disease with stage 1 through stage 4 chronic kidney disease, or unspecified chronic kidney disease: Secondary | ICD-10-CM | POA: Diagnosis not present

## 2017-08-24 DIAGNOSIS — M6281 Muscle weakness (generalized): Secondary | ICD-10-CM | POA: Diagnosis not present

## 2017-08-24 DIAGNOSIS — M1991 Primary osteoarthritis, unspecified site: Secondary | ICD-10-CM | POA: Diagnosis not present

## 2017-08-24 DIAGNOSIS — Z87440 Personal history of urinary (tract) infections: Secondary | ICD-10-CM | POA: Diagnosis not present

## 2017-08-24 DIAGNOSIS — E46 Unspecified protein-calorie malnutrition: Secondary | ICD-10-CM | POA: Diagnosis not present

## 2017-08-24 DIAGNOSIS — N184 Chronic kidney disease, stage 4 (severe): Secondary | ICD-10-CM | POA: Diagnosis not present

## 2017-08-24 DIAGNOSIS — Z7982 Long term (current) use of aspirin: Secondary | ICD-10-CM | POA: Diagnosis not present

## 2017-08-24 DIAGNOSIS — E1140 Type 2 diabetes mellitus with diabetic neuropathy, unspecified: Secondary | ICD-10-CM | POA: Diagnosis not present

## 2017-08-24 DIAGNOSIS — M109 Gout, unspecified: Secondary | ICD-10-CM | POA: Diagnosis not present

## 2017-08-30 DIAGNOSIS — E1140 Type 2 diabetes mellitus with diabetic neuropathy, unspecified: Secondary | ICD-10-CM | POA: Diagnosis not present

## 2017-08-30 DIAGNOSIS — N184 Chronic kidney disease, stage 4 (severe): Secondary | ICD-10-CM | POA: Diagnosis not present

## 2017-08-30 DIAGNOSIS — Z7982 Long term (current) use of aspirin: Secondary | ICD-10-CM | POA: Diagnosis not present

## 2017-08-30 DIAGNOSIS — Z87440 Personal history of urinary (tract) infections: Secondary | ICD-10-CM | POA: Diagnosis not present

## 2017-08-30 DIAGNOSIS — Z79891 Long term (current) use of opiate analgesic: Secondary | ICD-10-CM | POA: Diagnosis not present

## 2017-08-30 DIAGNOSIS — E46 Unspecified protein-calorie malnutrition: Secondary | ICD-10-CM | POA: Diagnosis not present

## 2017-08-30 DIAGNOSIS — M1991 Primary osteoarthritis, unspecified site: Secondary | ICD-10-CM | POA: Diagnosis not present

## 2017-08-30 DIAGNOSIS — M109 Gout, unspecified: Secondary | ICD-10-CM | POA: Diagnosis not present

## 2017-08-30 DIAGNOSIS — J849 Interstitial pulmonary disease, unspecified: Secondary | ICD-10-CM | POA: Diagnosis not present

## 2017-08-30 DIAGNOSIS — I129 Hypertensive chronic kidney disease with stage 1 through stage 4 chronic kidney disease, or unspecified chronic kidney disease: Secondary | ICD-10-CM | POA: Diagnosis not present

## 2017-08-30 DIAGNOSIS — E1122 Type 2 diabetes mellitus with diabetic chronic kidney disease: Secondary | ICD-10-CM | POA: Diagnosis not present

## 2017-08-30 DIAGNOSIS — M6281 Muscle weakness (generalized): Secondary | ICD-10-CM | POA: Diagnosis not present

## 2017-09-01 DIAGNOSIS — M1000 Idiopathic gout, unspecified site: Secondary | ICD-10-CM | POA: Diagnosis not present

## 2017-09-01 DIAGNOSIS — R918 Other nonspecific abnormal finding of lung field: Secondary | ICD-10-CM | POA: Diagnosis not present

## 2017-09-01 DIAGNOSIS — N183 Chronic kidney disease, stage 3 (moderate): Secondary | ICD-10-CM | POA: Diagnosis not present

## 2017-09-01 DIAGNOSIS — I130 Hypertensive heart and chronic kidney disease with heart failure and stage 1 through stage 4 chronic kidney disease, or unspecified chronic kidney disease: Secondary | ICD-10-CM | POA: Diagnosis not present

## 2017-09-01 DIAGNOSIS — I10 Essential (primary) hypertension: Secondary | ICD-10-CM | POA: Diagnosis not present

## 2017-09-01 DIAGNOSIS — M5136 Other intervertebral disc degeneration, lumbar region: Secondary | ICD-10-CM | POA: Diagnosis not present

## 2017-09-01 DIAGNOSIS — I5032 Chronic diastolic (congestive) heart failure: Secondary | ICD-10-CM | POA: Diagnosis not present

## 2017-09-01 DIAGNOSIS — J8410 Pulmonary fibrosis, unspecified: Secondary | ICD-10-CM | POA: Diagnosis not present

## 2017-09-01 DIAGNOSIS — Z79899 Other long term (current) drug therapy: Secondary | ICD-10-CM | POA: Diagnosis not present

## 2017-09-01 DIAGNOSIS — Z87891 Personal history of nicotine dependence: Secondary | ICD-10-CM | POA: Diagnosis not present

## 2017-09-01 DIAGNOSIS — N184 Chronic kidney disease, stage 4 (severe): Secondary | ICD-10-CM | POA: Diagnosis not present

## 2017-09-01 DIAGNOSIS — E1142 Type 2 diabetes mellitus with diabetic polyneuropathy: Secondary | ICD-10-CM | POA: Diagnosis not present

## 2017-09-01 DIAGNOSIS — M545 Low back pain: Secondary | ICD-10-CM | POA: Diagnosis not present

## 2017-09-01 DIAGNOSIS — R0602 Shortness of breath: Secondary | ICD-10-CM | POA: Diagnosis not present

## 2017-09-01 DIAGNOSIS — J849 Interstitial pulmonary disease, unspecified: Secondary | ICD-10-CM | POA: Diagnosis not present

## 2017-09-01 DIAGNOSIS — I700 Atherosclerosis of aorta: Secondary | ICD-10-CM | POA: Diagnosis not present

## 2017-09-01 DIAGNOSIS — Z7984 Long term (current) use of oral hypoglycemic drugs: Secondary | ICD-10-CM | POA: Diagnosis not present

## 2017-09-01 DIAGNOSIS — K219 Gastro-esophageal reflux disease without esophagitis: Secondary | ICD-10-CM | POA: Diagnosis not present

## 2017-09-01 DIAGNOSIS — K225 Diverticulum of esophagus, acquired: Secondary | ICD-10-CM | POA: Diagnosis not present

## 2017-09-01 DIAGNOSIS — M1A9XX0 Chronic gout, unspecified, without tophus (tophi): Secondary | ICD-10-CM | POA: Diagnosis not present

## 2017-09-01 DIAGNOSIS — J8489 Other specified interstitial pulmonary diseases: Secondary | ICD-10-CM | POA: Diagnosis not present

## 2017-09-01 DIAGNOSIS — Z87440 Personal history of urinary (tract) infections: Secondary | ICD-10-CM | POA: Diagnosis not present

## 2017-09-01 DIAGNOSIS — E785 Hyperlipidemia, unspecified: Secondary | ICD-10-CM | POA: Diagnosis not present

## 2017-09-01 DIAGNOSIS — R079 Chest pain, unspecified: Secondary | ICD-10-CM | POA: Diagnosis not present

## 2017-09-01 DIAGNOSIS — I5033 Acute on chronic diastolic (congestive) heart failure: Secondary | ICD-10-CM | POA: Diagnosis not present

## 2017-09-01 DIAGNOSIS — E1122 Type 2 diabetes mellitus with diabetic chronic kidney disease: Secondary | ICD-10-CM | POA: Diagnosis not present

## 2017-09-01 DIAGNOSIS — Z7982 Long term (current) use of aspirin: Secondary | ICD-10-CM | POA: Diagnosis not present

## 2017-09-02 DIAGNOSIS — E785 Hyperlipidemia, unspecified: Secondary | ICD-10-CM | POA: Diagnosis not present

## 2017-09-02 DIAGNOSIS — J8410 Pulmonary fibrosis, unspecified: Secondary | ICD-10-CM | POA: Diagnosis not present

## 2017-09-02 DIAGNOSIS — I5032 Chronic diastolic (congestive) heart failure: Secondary | ICD-10-CM | POA: Diagnosis not present

## 2017-09-02 DIAGNOSIS — Z7984 Long term (current) use of oral hypoglycemic drugs: Secondary | ICD-10-CM | POA: Diagnosis not present

## 2017-09-02 DIAGNOSIS — M1A9XX0 Chronic gout, unspecified, without tophus (tophi): Secondary | ICD-10-CM | POA: Diagnosis not present

## 2017-09-02 DIAGNOSIS — N183 Chronic kidney disease, stage 3 (moderate): Secondary | ICD-10-CM | POA: Diagnosis not present

## 2017-09-02 DIAGNOSIS — I130 Hypertensive heart and chronic kidney disease with heart failure and stage 1 through stage 4 chronic kidney disease, or unspecified chronic kidney disease: Secondary | ICD-10-CM | POA: Diagnosis not present

## 2017-09-02 DIAGNOSIS — Z7982 Long term (current) use of aspirin: Secondary | ICD-10-CM | POA: Diagnosis not present

## 2017-09-02 DIAGNOSIS — Z79899 Other long term (current) drug therapy: Secondary | ICD-10-CM | POA: Diagnosis not present

## 2017-09-02 DIAGNOSIS — Z87891 Personal history of nicotine dependence: Secondary | ICD-10-CM | POA: Diagnosis not present

## 2017-09-02 DIAGNOSIS — Z87440 Personal history of urinary (tract) infections: Secondary | ICD-10-CM | POA: Diagnosis not present

## 2017-09-02 DIAGNOSIS — I700 Atherosclerosis of aorta: Secondary | ICD-10-CM | POA: Diagnosis not present

## 2017-09-02 DIAGNOSIS — J849 Interstitial pulmonary disease, unspecified: Secondary | ICD-10-CM | POA: Diagnosis not present

## 2017-09-02 DIAGNOSIS — M5136 Other intervertebral disc degeneration, lumbar region: Secondary | ICD-10-CM | POA: Diagnosis not present

## 2017-09-02 DIAGNOSIS — K225 Diverticulum of esophagus, acquired: Secondary | ICD-10-CM | POA: Diagnosis not present

## 2017-09-02 DIAGNOSIS — E1122 Type 2 diabetes mellitus with diabetic chronic kidney disease: Secondary | ICD-10-CM | POA: Diagnosis not present

## 2017-09-02 DIAGNOSIS — R079 Chest pain, unspecified: Secondary | ICD-10-CM | POA: Diagnosis not present

## 2017-09-02 DIAGNOSIS — J8489 Other specified interstitial pulmonary diseases: Secondary | ICD-10-CM | POA: Diagnosis not present

## 2017-09-02 DIAGNOSIS — K219 Gastro-esophageal reflux disease without esophagitis: Secondary | ICD-10-CM | POA: Diagnosis not present

## 2017-09-04 DIAGNOSIS — E1122 Type 2 diabetes mellitus with diabetic chronic kidney disease: Secondary | ICD-10-CM | POA: Diagnosis not present

## 2017-09-04 DIAGNOSIS — I130 Hypertensive heart and chronic kidney disease with heart failure and stage 1 through stage 4 chronic kidney disease, or unspecified chronic kidney disease: Secondary | ICD-10-CM | POA: Diagnosis not present

## 2017-09-04 DIAGNOSIS — I5032 Chronic diastolic (congestive) heart failure: Secondary | ICD-10-CM | POA: Diagnosis not present

## 2017-09-04 DIAGNOSIS — J849 Interstitial pulmonary disease, unspecified: Secondary | ICD-10-CM | POA: Diagnosis not present

## 2017-09-04 DIAGNOSIS — Z87891 Personal history of nicotine dependence: Secondary | ICD-10-CM | POA: Diagnosis not present

## 2017-09-04 DIAGNOSIS — E785 Hyperlipidemia, unspecified: Secondary | ICD-10-CM | POA: Diagnosis not present

## 2017-09-04 DIAGNOSIS — M5136 Other intervertebral disc degeneration, lumbar region: Secondary | ICD-10-CM | POA: Diagnosis not present

## 2017-09-04 DIAGNOSIS — J8489 Other specified interstitial pulmonary diseases: Secondary | ICD-10-CM | POA: Diagnosis not present

## 2017-09-04 DIAGNOSIS — K219 Gastro-esophageal reflux disease without esophagitis: Secondary | ICD-10-CM | POA: Diagnosis not present

## 2017-09-04 DIAGNOSIS — N183 Chronic kidney disease, stage 3 (moderate): Secondary | ICD-10-CM | POA: Diagnosis not present

## 2017-09-04 DIAGNOSIS — N184 Chronic kidney disease, stage 4 (severe): Secondary | ICD-10-CM | POA: Diagnosis not present

## 2017-09-04 DIAGNOSIS — Z79899 Other long term (current) drug therapy: Secondary | ICD-10-CM | POA: Diagnosis not present

## 2017-09-04 DIAGNOSIS — I700 Atherosclerosis of aorta: Secondary | ICD-10-CM | POA: Diagnosis not present

## 2017-09-04 DIAGNOSIS — I10 Essential (primary) hypertension: Secondary | ICD-10-CM | POA: Diagnosis not present

## 2017-09-04 DIAGNOSIS — K225 Diverticulum of esophagus, acquired: Secondary | ICD-10-CM | POA: Diagnosis not present

## 2017-09-04 DIAGNOSIS — M1000 Idiopathic gout, unspecified site: Secondary | ICD-10-CM | POA: Diagnosis not present

## 2017-09-04 DIAGNOSIS — J8410 Pulmonary fibrosis, unspecified: Secondary | ICD-10-CM | POA: Diagnosis not present

## 2017-09-04 DIAGNOSIS — Z7982 Long term (current) use of aspirin: Secondary | ICD-10-CM | POA: Diagnosis not present

## 2017-09-04 DIAGNOSIS — R079 Chest pain, unspecified: Secondary | ICD-10-CM | POA: Diagnosis not present

## 2017-09-04 DIAGNOSIS — Z87440 Personal history of urinary (tract) infections: Secondary | ICD-10-CM | POA: Diagnosis not present

## 2017-09-04 DIAGNOSIS — E1142 Type 2 diabetes mellitus with diabetic polyneuropathy: Secondary | ICD-10-CM | POA: Diagnosis not present

## 2017-09-04 DIAGNOSIS — Z7984 Long term (current) use of oral hypoglycemic drugs: Secondary | ICD-10-CM | POA: Diagnosis not present

## 2017-09-04 DIAGNOSIS — M1A9XX0 Chronic gout, unspecified, without tophus (tophi): Secondary | ICD-10-CM | POA: Diagnosis not present

## 2017-09-06 DIAGNOSIS — M109 Gout, unspecified: Secondary | ICD-10-CM | POA: Diagnosis not present

## 2017-09-06 DIAGNOSIS — E785 Hyperlipidemia, unspecified: Secondary | ICD-10-CM | POA: Diagnosis not present

## 2017-09-08 DIAGNOSIS — Z7982 Long term (current) use of aspirin: Secondary | ICD-10-CM | POA: Diagnosis not present

## 2017-09-08 DIAGNOSIS — I129 Hypertensive chronic kidney disease with stage 1 through stage 4 chronic kidney disease, or unspecified chronic kidney disease: Secondary | ICD-10-CM | POA: Diagnosis not present

## 2017-09-08 DIAGNOSIS — M6281 Muscle weakness (generalized): Secondary | ICD-10-CM | POA: Diagnosis not present

## 2017-09-08 DIAGNOSIS — Z87440 Personal history of urinary (tract) infections: Secondary | ICD-10-CM | POA: Diagnosis not present

## 2017-09-08 DIAGNOSIS — M1991 Primary osteoarthritis, unspecified site: Secondary | ICD-10-CM | POA: Diagnosis not present

## 2017-09-08 DIAGNOSIS — E1122 Type 2 diabetes mellitus with diabetic chronic kidney disease: Secondary | ICD-10-CM | POA: Diagnosis not present

## 2017-09-08 DIAGNOSIS — N184 Chronic kidney disease, stage 4 (severe): Secondary | ICD-10-CM | POA: Diagnosis not present

## 2017-09-08 DIAGNOSIS — J849 Interstitial pulmonary disease, unspecified: Secondary | ICD-10-CM | POA: Diagnosis not present

## 2017-09-08 DIAGNOSIS — M109 Gout, unspecified: Secondary | ICD-10-CM | POA: Diagnosis not present

## 2017-09-08 DIAGNOSIS — Z79891 Long term (current) use of opiate analgesic: Secondary | ICD-10-CM | POA: Diagnosis not present

## 2017-09-08 DIAGNOSIS — E46 Unspecified protein-calorie malnutrition: Secondary | ICD-10-CM | POA: Diagnosis not present

## 2017-09-08 DIAGNOSIS — E1140 Type 2 diabetes mellitus with diabetic neuropathy, unspecified: Secondary | ICD-10-CM | POA: Diagnosis not present

## 2017-09-09 DIAGNOSIS — J849 Interstitial pulmonary disease, unspecified: Secondary | ICD-10-CM | POA: Diagnosis not present

## 2017-09-12 DIAGNOSIS — E1140 Type 2 diabetes mellitus with diabetic neuropathy, unspecified: Secondary | ICD-10-CM | POA: Diagnosis not present

## 2017-09-12 DIAGNOSIS — J849 Interstitial pulmonary disease, unspecified: Secondary | ICD-10-CM | POA: Diagnosis not present

## 2017-09-12 DIAGNOSIS — N184 Chronic kidney disease, stage 4 (severe): Secondary | ICD-10-CM | POA: Diagnosis not present

## 2017-09-12 DIAGNOSIS — I129 Hypertensive chronic kidney disease with stage 1 through stage 4 chronic kidney disease, or unspecified chronic kidney disease: Secondary | ICD-10-CM | POA: Diagnosis not present

## 2017-09-12 DIAGNOSIS — M6281 Muscle weakness (generalized): Secondary | ICD-10-CM | POA: Diagnosis not present

## 2017-09-12 DIAGNOSIS — M109 Gout, unspecified: Secondary | ICD-10-CM | POA: Diagnosis not present

## 2017-09-12 DIAGNOSIS — E46 Unspecified protein-calorie malnutrition: Secondary | ICD-10-CM | POA: Diagnosis not present

## 2017-09-12 DIAGNOSIS — Z7982 Long term (current) use of aspirin: Secondary | ICD-10-CM | POA: Diagnosis not present

## 2017-09-12 DIAGNOSIS — E1122 Type 2 diabetes mellitus with diabetic chronic kidney disease: Secondary | ICD-10-CM | POA: Diagnosis not present

## 2017-09-12 DIAGNOSIS — M1991 Primary osteoarthritis, unspecified site: Secondary | ICD-10-CM | POA: Diagnosis not present

## 2017-09-12 DIAGNOSIS — Z79891 Long term (current) use of opiate analgesic: Secondary | ICD-10-CM | POA: Diagnosis not present

## 2017-09-12 DIAGNOSIS — Z87440 Personal history of urinary (tract) infections: Secondary | ICD-10-CM | POA: Diagnosis not present

## 2017-09-14 DIAGNOSIS — Z79891 Long term (current) use of opiate analgesic: Secondary | ICD-10-CM | POA: Diagnosis not present

## 2017-09-14 DIAGNOSIS — E46 Unspecified protein-calorie malnutrition: Secondary | ICD-10-CM | POA: Diagnosis not present

## 2017-09-14 DIAGNOSIS — E1122 Type 2 diabetes mellitus with diabetic chronic kidney disease: Secondary | ICD-10-CM | POA: Diagnosis not present

## 2017-09-14 DIAGNOSIS — M109 Gout, unspecified: Secondary | ICD-10-CM | POA: Diagnosis not present

## 2017-09-14 DIAGNOSIS — Z87440 Personal history of urinary (tract) infections: Secondary | ICD-10-CM | POA: Diagnosis not present

## 2017-09-14 DIAGNOSIS — M6281 Muscle weakness (generalized): Secondary | ICD-10-CM | POA: Diagnosis not present

## 2017-09-14 DIAGNOSIS — N184 Chronic kidney disease, stage 4 (severe): Secondary | ICD-10-CM | POA: Diagnosis not present

## 2017-09-14 DIAGNOSIS — E1140 Type 2 diabetes mellitus with diabetic neuropathy, unspecified: Secondary | ICD-10-CM | POA: Diagnosis not present

## 2017-09-14 DIAGNOSIS — I129 Hypertensive chronic kidney disease with stage 1 through stage 4 chronic kidney disease, or unspecified chronic kidney disease: Secondary | ICD-10-CM | POA: Diagnosis not present

## 2017-09-14 DIAGNOSIS — M1991 Primary osteoarthritis, unspecified site: Secondary | ICD-10-CM | POA: Diagnosis not present

## 2017-09-14 DIAGNOSIS — Z7982 Long term (current) use of aspirin: Secondary | ICD-10-CM | POA: Diagnosis not present

## 2017-09-14 DIAGNOSIS — J849 Interstitial pulmonary disease, unspecified: Secondary | ICD-10-CM | POA: Diagnosis not present

## 2017-09-20 DIAGNOSIS — M6281 Muscle weakness (generalized): Secondary | ICD-10-CM | POA: Diagnosis not present

## 2017-09-20 DIAGNOSIS — Z87440 Personal history of urinary (tract) infections: Secondary | ICD-10-CM | POA: Diagnosis not present

## 2017-09-20 DIAGNOSIS — M109 Gout, unspecified: Secondary | ICD-10-CM | POA: Diagnosis not present

## 2017-09-20 DIAGNOSIS — E1122 Type 2 diabetes mellitus with diabetic chronic kidney disease: Secondary | ICD-10-CM | POA: Diagnosis not present

## 2017-09-20 DIAGNOSIS — Z79891 Long term (current) use of opiate analgesic: Secondary | ICD-10-CM | POA: Diagnosis not present

## 2017-09-20 DIAGNOSIS — M1991 Primary osteoarthritis, unspecified site: Secondary | ICD-10-CM | POA: Diagnosis not present

## 2017-09-20 DIAGNOSIS — I129 Hypertensive chronic kidney disease with stage 1 through stage 4 chronic kidney disease, or unspecified chronic kidney disease: Secondary | ICD-10-CM | POA: Diagnosis not present

## 2017-09-20 DIAGNOSIS — J849 Interstitial pulmonary disease, unspecified: Secondary | ICD-10-CM | POA: Diagnosis not present

## 2017-09-20 DIAGNOSIS — E46 Unspecified protein-calorie malnutrition: Secondary | ICD-10-CM | POA: Diagnosis not present

## 2017-09-20 DIAGNOSIS — N184 Chronic kidney disease, stage 4 (severe): Secondary | ICD-10-CM | POA: Diagnosis not present

## 2017-09-20 DIAGNOSIS — Z7982 Long term (current) use of aspirin: Secondary | ICD-10-CM | POA: Diagnosis not present

## 2017-09-20 DIAGNOSIS — E1140 Type 2 diabetes mellitus with diabetic neuropathy, unspecified: Secondary | ICD-10-CM | POA: Diagnosis not present

## 2017-09-22 DIAGNOSIS — E1140 Type 2 diabetes mellitus with diabetic neuropathy, unspecified: Secondary | ICD-10-CM | POA: Diagnosis not present

## 2017-09-22 DIAGNOSIS — M6281 Muscle weakness (generalized): Secondary | ICD-10-CM | POA: Diagnosis not present

## 2017-09-22 DIAGNOSIS — Z7982 Long term (current) use of aspirin: Secondary | ICD-10-CM | POA: Diagnosis not present

## 2017-09-22 DIAGNOSIS — M109 Gout, unspecified: Secondary | ICD-10-CM | POA: Diagnosis not present

## 2017-09-22 DIAGNOSIS — E1122 Type 2 diabetes mellitus with diabetic chronic kidney disease: Secondary | ICD-10-CM | POA: Diagnosis not present

## 2017-09-22 DIAGNOSIS — Z87440 Personal history of urinary (tract) infections: Secondary | ICD-10-CM | POA: Diagnosis not present

## 2017-09-22 DIAGNOSIS — Z79891 Long term (current) use of opiate analgesic: Secondary | ICD-10-CM | POA: Diagnosis not present

## 2017-09-22 DIAGNOSIS — J849 Interstitial pulmonary disease, unspecified: Secondary | ICD-10-CM | POA: Diagnosis not present

## 2017-09-22 DIAGNOSIS — I129 Hypertensive chronic kidney disease with stage 1 through stage 4 chronic kidney disease, or unspecified chronic kidney disease: Secondary | ICD-10-CM | POA: Diagnosis not present

## 2017-09-22 DIAGNOSIS — N184 Chronic kidney disease, stage 4 (severe): Secondary | ICD-10-CM | POA: Diagnosis not present

## 2017-09-22 DIAGNOSIS — E46 Unspecified protein-calorie malnutrition: Secondary | ICD-10-CM | POA: Diagnosis not present

## 2017-09-22 DIAGNOSIS — M1991 Primary osteoarthritis, unspecified site: Secondary | ICD-10-CM | POA: Diagnosis not present

## 2017-09-29 ENCOUNTER — Other Ambulatory Visit: Payer: Self-pay | Admitting: *Deleted

## 2017-09-29 NOTE — Patient Outreach (Signed)
[facility]) Care Management  09/29/2017  Courtney Madden 13-Oct-1940 [id number]   Referral Date: 09/26/17 Referral Source: MD referral from [facility] - Dr Gar Ponto Referral Reason: Patient needs to discuss Advance Directives and MOST form Insurance: [place] [employer]    Outreach attempt # 1 Spoke with [name] to review HIPPA and [facility] services. Patient gave verbal consent for services.   Social:  Patient states she lives with her grand daughter who  assists with her ADL and IADL care.    Conditions:  CAD, HTN, Interstitial lung disease, DM, II, Depression, anxiety, HDL "twenty [percent of my kidney is working", "throat surgery  Three and half years ago"   Medications: She confirms she is on DM, HTN, HDL, depression medications (had difficulty with the names of medications) and prednisone.  She has just called the primary MD today to get renewal of her prednisone. She reports she loss about 30 pounds and has a poor appetite.  Education of prednisone provided   Appointments: Reports her next scheduled follow up appointment is in July 2019 with Mr Jason Fila MD  Advance Directives: Patient confirms she does not have advance directives or MOST form completed. Reports she is under hospice care but does not know the name of the Hospice agency.  States the hospice agency staff visited but has not provided advanced directive or MOST forms at this time  Consent: RN CM reviewed [facility] services with patient. Patient gave verbal consent for services.  Plan: RN CM will send [facility] SW referral for assistance with community resources related to advance directives and MOST form  Cm left her contact number for the grand daughter to return a call prn   Braven Wolk L. Lavina Hamman, RN, BSN, CCM [facility] Telephonic Care Management Care Coordinator Direct number [phone]  Main [facility] number [phone] Fax number [phone]

## 2017-09-30 ENCOUNTER — Other Ambulatory Visit: Payer: Self-pay | Admitting: Licensed Clinical Social Worker

## 2017-09-30 ENCOUNTER — Other Ambulatory Visit: Payer: Self-pay | Admitting: *Deleted

## 2017-09-30 NOTE — Patient Outreach (Signed)
Assessment:  CSW received referral on Courtney Madden. RN Joellyn Quails sent CSW referral related to client request for MOST form completion and help with Advanced Directives completion for client. CSW called Amy, RN at practice of Dr. Gar Ponto , on 09/30/17. CSW introduced self and verified identity of Amy , RN at that practice. CSW talked with Amy about client requests related to MOST form completion and Advanced Directives completion. Amy reviewed medical notes and said that request had been made for Hospice referral for client. She , Amy, said that she would call Courtney Madden today and talk with Courtney Madden about MOST form completion and about Advanced Directives completion. Amy said that medical practice of Dr. Quillian Quince  would also work in [employer] with [facility] to ensure that these needed documents for client were completed.  CSW thanked Amy., RN at practice of Dr. Quillian Quince, for her assistance in addressing client needs.  Plan:  CSW is removing CSW name from care team since client's social work needs have been addressed  CSW is discharging Courtney Madden, closing case on Courtney Tamayo. Madden since office of Dr. Quillian Quince and Hospice agency will be working with client on completion of MOST form and on completion of advanced directives for client.   Courtney Madden MSW, LCSW Licensed Clinical Social Worker [facility] Care Management [phone]

## 2017-09-30 NOTE — Patient Outreach (Signed)
Courtney Madden [facility]) Care Management  09/30/2017  ZEMIRAH KRASINSKI [birth year] [id number]   Care coordination    [facility] CM received a message from [place], grand daughter to return a call to her  [facility] CM returned a call to [place] verification completed with Mrs Nevitt.  [facility] CM updated Christy on the call to Mrs Kasel with pending contact from [facility] SW to assist with Advance directives and MOST form Alyse Low voiced understanding and appreciation of services provided by [facility]. Lavina Hamman, RN, BSN, CCM [facility] Telephonic Care Management Care Coordinator Direct number [phone]  Main [facility] number [phone] Fax number [phone]

## 2017-10-04 ENCOUNTER — Institutional Professional Consult (permissible substitution): Payer: Medicare Other | Admitting: Emergency Medicine

## 2017-10-04 DIAGNOSIS — E1122 Type 2 diabetes mellitus with diabetic chronic kidney disease: Secondary | ICD-10-CM | POA: Diagnosis not present

## 2017-10-04 DIAGNOSIS — I10 Essential (primary) hypertension: Secondary | ICD-10-CM | POA: Diagnosis not present

## 2017-10-04 DIAGNOSIS — E782 Mixed hyperlipidemia: Secondary | ICD-10-CM | POA: Diagnosis not present

## 2017-10-06 DIAGNOSIS — E1122 Type 2 diabetes mellitus with diabetic chronic kidney disease: Secondary | ICD-10-CM | POA: Diagnosis not present

## 2017-10-06 DIAGNOSIS — E782 Mixed hyperlipidemia: Secondary | ICD-10-CM | POA: Diagnosis not present

## 2017-10-06 DIAGNOSIS — I10 Essential (primary) hypertension: Secondary | ICD-10-CM | POA: Diagnosis not present

## 2017-11-07 DIAGNOSIS — I10 Essential (primary) hypertension: Secondary | ICD-10-CM | POA: Diagnosis not present

## 2017-11-07 DIAGNOSIS — Z79891 Long term (current) use of opiate analgesic: Secondary | ICD-10-CM | POA: Diagnosis not present

## 2017-11-07 DIAGNOSIS — E1122 Type 2 diabetes mellitus with diabetic chronic kidney disease: Secondary | ICD-10-CM | POA: Diagnosis not present

## 2017-11-07 DIAGNOSIS — E782 Mixed hyperlipidemia: Secondary | ICD-10-CM | POA: Diagnosis not present

## 2017-11-07 DIAGNOSIS — Z1389 Encounter for screening for other disorder: Secondary | ICD-10-CM | POA: Diagnosis not present

## 2017-11-07 DIAGNOSIS — E1142 Type 2 diabetes mellitus with diabetic polyneuropathy: Secondary | ICD-10-CM | POA: Diagnosis not present

## 2018-02-21 DIAGNOSIS — R000 Tachycardia, unspecified: Secondary | ICD-10-CM | POA: Diagnosis not present

## 2018-02-21 DIAGNOSIS — Z7952 Long term (current) use of systemic steroids: Secondary | ICD-10-CM | POA: Diagnosis not present

## 2018-02-21 DIAGNOSIS — Z743 Need for continuous supervision: Secondary | ICD-10-CM | POA: Diagnosis not present

## 2018-02-21 DIAGNOSIS — R1084 Generalized abdominal pain: Secondary | ICD-10-CM | POA: Diagnosis not present

## 2018-02-21 DIAGNOSIS — Z87891 Personal history of nicotine dependence: Secondary | ICD-10-CM | POA: Diagnosis not present

## 2018-02-21 DIAGNOSIS — N200 Calculus of kidney: Secondary | ICD-10-CM | POA: Diagnosis not present

## 2018-02-21 DIAGNOSIS — Z7984 Long term (current) use of oral hypoglycemic drugs: Secondary | ICD-10-CM | POA: Diagnosis not present

## 2018-02-21 DIAGNOSIS — I700 Atherosclerosis of aorta: Secondary | ICD-10-CM | POA: Diagnosis not present

## 2018-02-21 DIAGNOSIS — Z87440 Personal history of urinary (tract) infections: Secondary | ICD-10-CM | POA: Diagnosis not present

## 2018-02-21 DIAGNOSIS — Z79899 Other long term (current) drug therapy: Secondary | ICD-10-CM | POA: Diagnosis not present

## 2018-02-21 DIAGNOSIS — N390 Urinary tract infection, site not specified: Secondary | ICD-10-CM | POA: Diagnosis not present

## 2018-02-21 DIAGNOSIS — I10 Essential (primary) hypertension: Secondary | ICD-10-CM | POA: Diagnosis not present

## 2018-02-21 DIAGNOSIS — R5381 Other malaise: Secondary | ICD-10-CM | POA: Diagnosis not present

## 2018-02-21 DIAGNOSIS — E119 Type 2 diabetes mellitus without complications: Secondary | ICD-10-CM | POA: Diagnosis not present

## 2018-03-10 DEATH — deceased

## 2018-12-24 IMAGING — RF DG SWALLOWING FUNCTION - NRPT [facility]
13 of 19 series · 13 of 24 positions shown · non-contrast
Comparison: none

[Series 1: before po · 1 of 67 frames shown]
[frame 11/67]
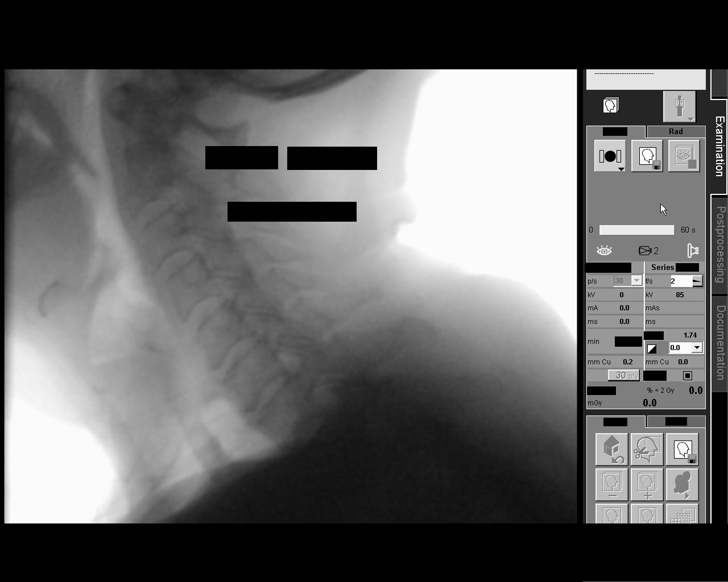

[Series 2: cup thin · 1 of 176 frames shown (1 of 2)]
[frame 150/176]
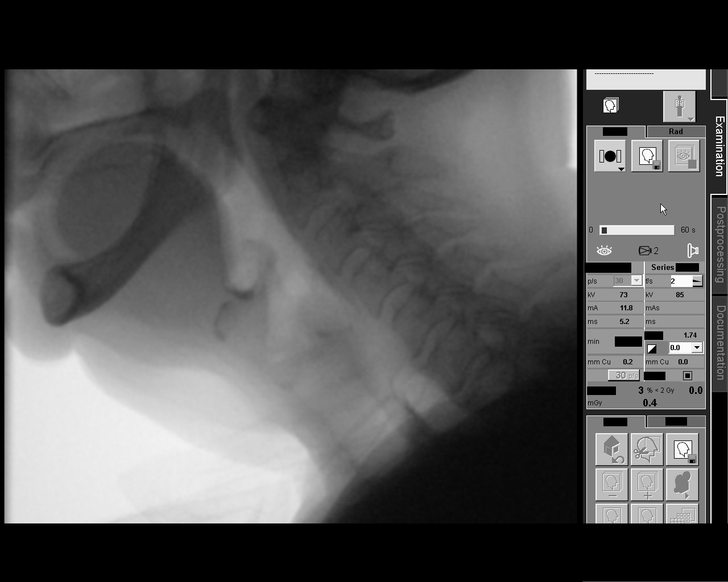

[Series 4: run · 1 of 71 frames shown (1 of 5)]
[frame 15/71]
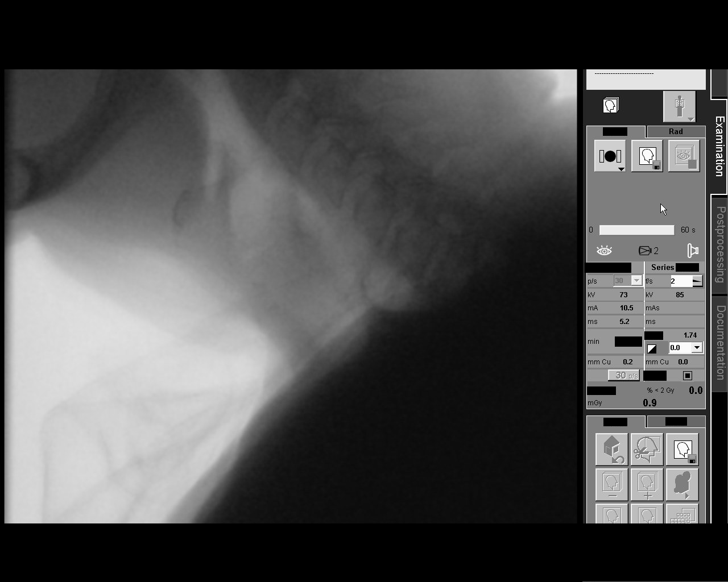

[Series 6: sweep · 1 of 148 frames shown]
[frame 20/148]
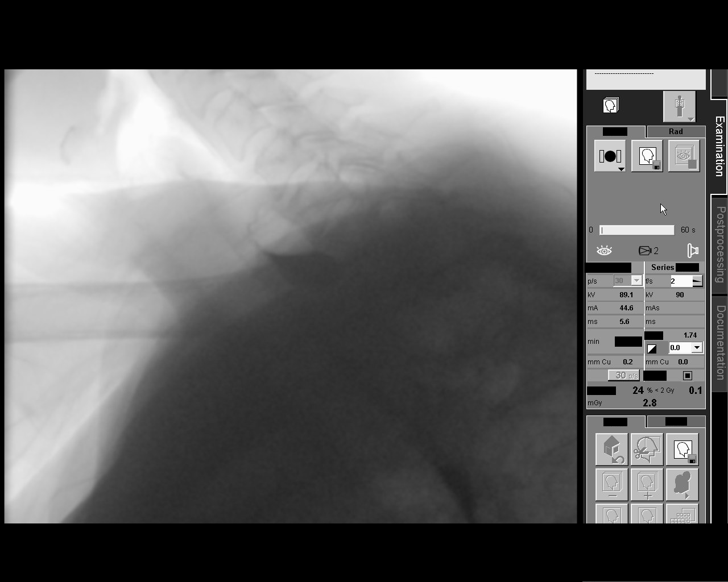

[Series 7: cup thin · 1 of 549 frames shown (2 of 2)]
[frame 275/549]
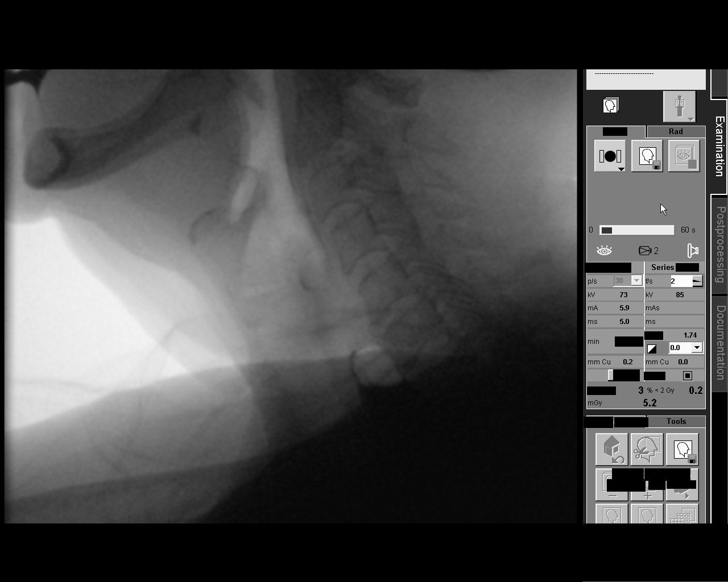

[Series 9: run · 1 of 43 frames shown (2 of 5)]
[frame 22/43]
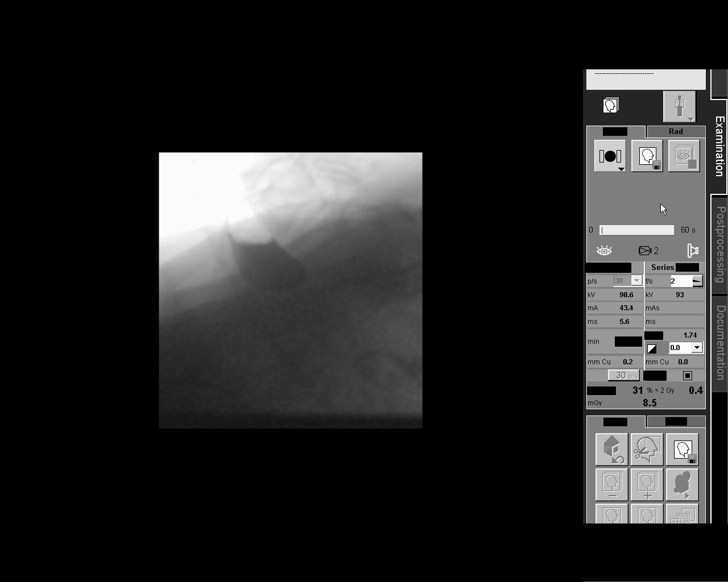

[Series 10: run · 1 of 41 frames shown (3 of 5)]
[frame 35/41]
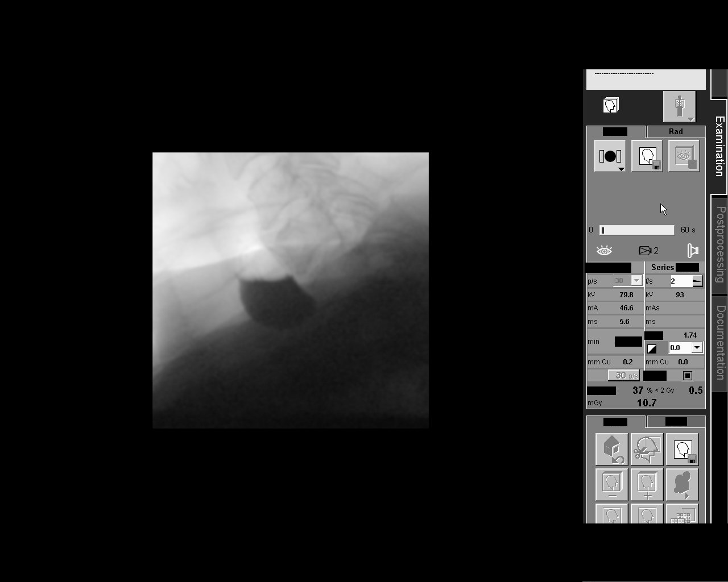

[Series 11: run · 1 of 62 frames shown (4 of 5)]
[frame 53/62]
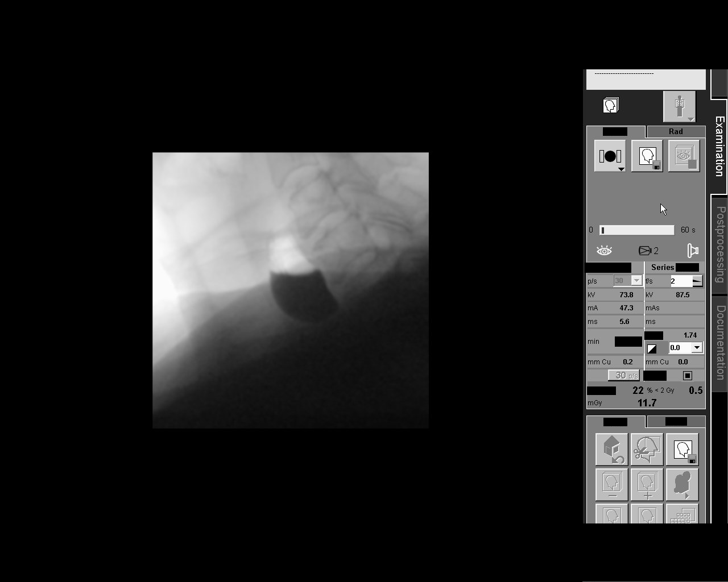

[Series 13: run · 1 of 24 frames shown (5 of 5)]
[frame 13/24]
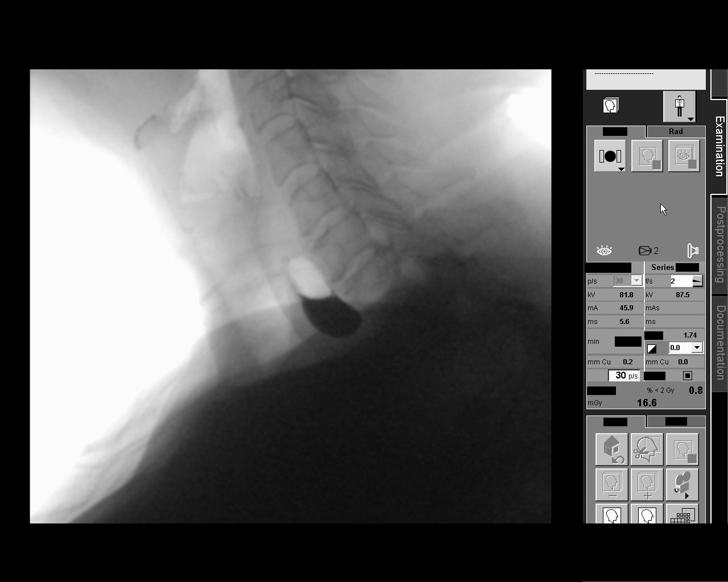

[Series 14: puree · 1 of 341 frames shown (1 of 3)]
[frame 290/341]
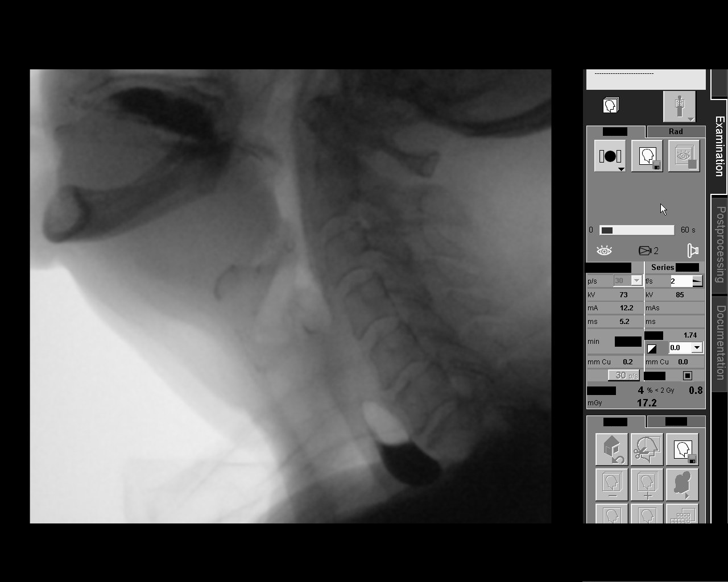

[Series 16: puree · 1 of 175 frames shown (2 of 3)]
[frame 128/175]
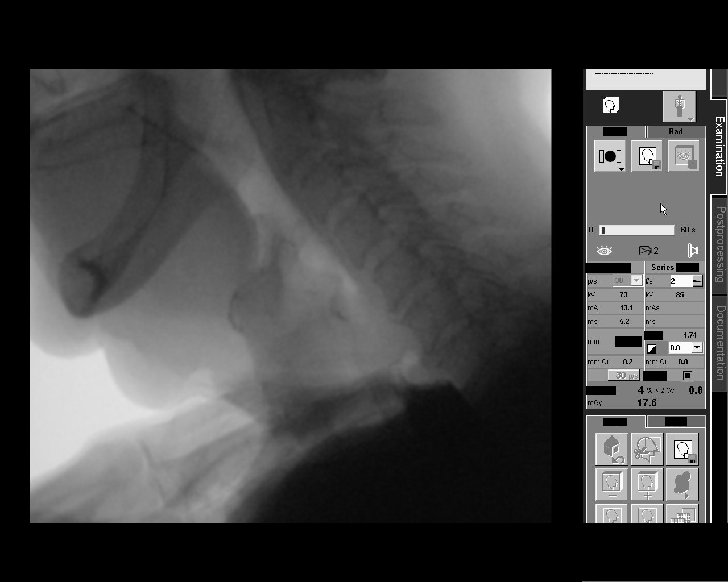

[Series 18: puree · 1 of 81 frames shown (3 of 3)]
[frame 12/81]
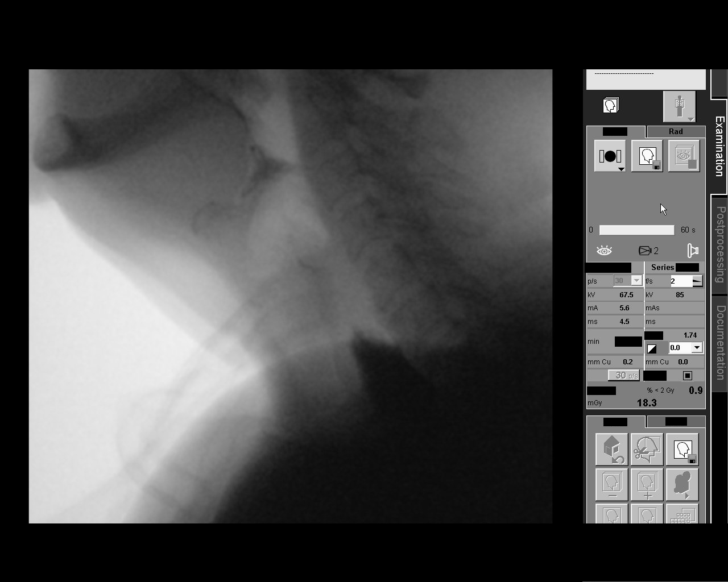

[Series 19: straw thin with head turn left · 1 of 378 frames shown]
[frame 322/378]
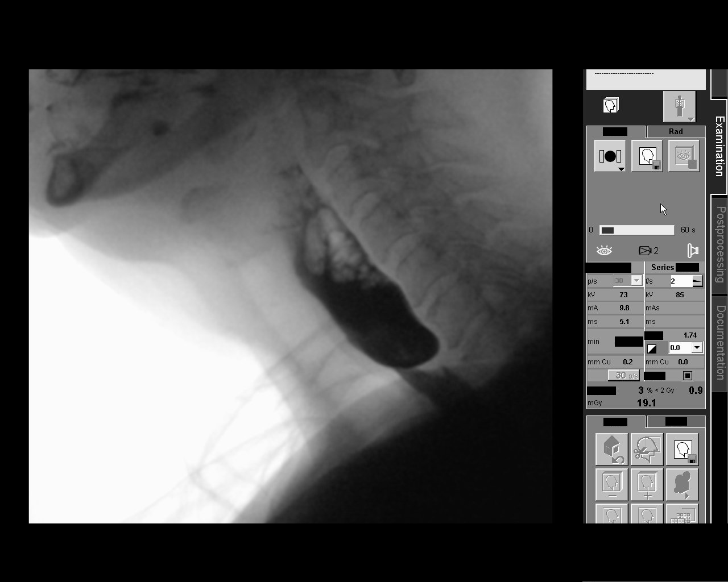

[13 of 24 positions shown; findings below may reference images not displayed]

FLUOROSCOPY FOR SWALLOWING FUNCTION STUDY:
Fluoroscopy was provided for swallowing function study, which was administered by a speech pathologist.  Final results and recommendations from this study are contained within the speech pathology report.
